# Patient Record
Sex: Male | Born: 1950 | Race: Black or African American | Hispanic: No | Smoking: Former smoker
Health system: Southern US, Community
[De-identification: ages and names within clinical notes are randomized; demographics above are authoritative.]

## PROBLEM LIST (undated history)

## (undated) DIAGNOSIS — R569 Unspecified convulsions: Secondary | ICD-10-CM

## (undated) DIAGNOSIS — H409 Unspecified glaucoma: Secondary | ICD-10-CM

---

## 2000-08-25 ENCOUNTER — Ambulatory Visit (HOSPITAL_COMMUNITY): Admission: RE | Admit: 2000-08-25 | Discharge: 2000-08-25 | Payer: Self-pay | Admitting: Orthopaedic Surgery

## 2001-01-19 ENCOUNTER — Ambulatory Visit (HOSPITAL_COMMUNITY): Admission: RE | Admit: 2001-01-19 | Discharge: 2001-01-19 | Payer: Self-pay | Admitting: Orthopaedic Surgery

## 2001-03-17 ENCOUNTER — Inpatient Hospital Stay (HOSPITAL_COMMUNITY): Admission: AD | Admit: 2001-03-17 | Discharge: 2001-03-18 | Payer: Self-pay | Admitting: Orthopaedic Surgery

## 2019-09-02 ENCOUNTER — Inpatient Hospital Stay (HOSPITAL_COMMUNITY)
Admission: EM | Admit: 2019-09-02 | Discharge: 2019-09-14 | DRG: 516 | Disposition: A | Discharge status: Skilled Nursing Facility | Payer: Medicare Other | Attending: Internal Medicine | Admitting: Internal Medicine

## 2019-09-02 ENCOUNTER — Other Ambulatory Visit: Payer: Self-pay

## 2019-09-02 ENCOUNTER — Encounter (HOSPITAL_COMMUNITY): Payer: Self-pay

## 2019-09-02 ENCOUNTER — Emergency Department (HOSPITAL_COMMUNITY): Payer: Medicare Other

## 2019-09-02 DIAGNOSIS — S7292XA Unspecified fracture of left femur, initial encounter for closed fracture: Secondary | ICD-10-CM | POA: Diagnosis not present

## 2019-09-02 DIAGNOSIS — D6861 Antiphospholipid syndrome: Secondary | ICD-10-CM | POA: Diagnosis present

## 2019-09-02 DIAGNOSIS — D62 Acute posthemorrhagic anemia: Secondary | ICD-10-CM | POA: Diagnosis not present

## 2019-09-02 DIAGNOSIS — Z23 Encounter for immunization: Secondary | ICD-10-CM | POA: Diagnosis present

## 2019-09-02 DIAGNOSIS — Z87891 Personal history of nicotine dependence: Secondary | ICD-10-CM

## 2019-09-02 DIAGNOSIS — S72142A Displaced intertrochanteric fracture of left femur, initial encounter for closed fracture: Secondary | ICD-10-CM | POA: Diagnosis present

## 2019-09-02 DIAGNOSIS — S42002A Fracture of unspecified part of left clavicle, initial encounter for closed fracture: Secondary | ICD-10-CM | POA: Diagnosis not present

## 2019-09-02 DIAGNOSIS — E8809 Other disorders of plasma-protein metabolism, not elsewhere classified: Secondary | ICD-10-CM | POA: Diagnosis present

## 2019-09-02 DIAGNOSIS — M25462 Effusion, left knee: Secondary | ICD-10-CM

## 2019-09-02 DIAGNOSIS — Z833 Family history of diabetes mellitus: Secondary | ICD-10-CM | POA: Diagnosis not present

## 2019-09-02 DIAGNOSIS — R509 Fever, unspecified: Secondary | ICD-10-CM

## 2019-09-02 DIAGNOSIS — M25571 Pain in right ankle and joints of right foot: Secondary | ICD-10-CM | POA: Diagnosis not present

## 2019-09-02 DIAGNOSIS — Z20828 Contact with and (suspected) exposure to other viral communicable diseases: Secondary | ICD-10-CM | POA: Diagnosis present

## 2019-09-02 DIAGNOSIS — E46 Unspecified protein-calorie malnutrition: Secondary | ICD-10-CM | POA: Diagnosis present

## 2019-09-02 DIAGNOSIS — S42025A Nondisplaced fracture of shaft of left clavicle, initial encounter for closed fracture: Secondary | ICD-10-CM | POA: Diagnosis present

## 2019-09-02 DIAGNOSIS — D509 Iron deficiency anemia, unspecified: Secondary | ICD-10-CM | POA: Diagnosis not present

## 2019-09-02 DIAGNOSIS — D638 Anemia in other chronic diseases classified elsewhere: Secondary | ICD-10-CM | POA: Diagnosis present

## 2019-09-02 DIAGNOSIS — Y92410 Unspecified street and highway as the place of occurrence of the external cause: Secondary | ICD-10-CM

## 2019-09-02 DIAGNOSIS — S72002A Fracture of unspecified part of neck of left femur, initial encounter for closed fracture: Secondary | ICD-10-CM | POA: Diagnosis present

## 2019-09-02 DIAGNOSIS — Z9889 Other specified postprocedural states: Secondary | ICD-10-CM | POA: Diagnosis not present

## 2019-09-02 DIAGNOSIS — G40909 Epilepsy, unspecified, not intractable, without status epilepticus: Secondary | ICD-10-CM

## 2019-09-02 DIAGNOSIS — Z7982 Long term (current) use of aspirin: Secondary | ICD-10-CM

## 2019-09-02 DIAGNOSIS — S42022A Displaced fracture of shaft of left clavicle, initial encounter for closed fracture: Secondary | ICD-10-CM | POA: Diagnosis not present

## 2019-09-02 DIAGNOSIS — R5082 Postprocedural fever: Secondary | ICD-10-CM | POA: Diagnosis not present

## 2019-09-02 DIAGNOSIS — Z79899 Other long term (current) drug therapy: Secondary | ICD-10-CM

## 2019-09-02 DIAGNOSIS — S42009A Fracture of unspecified part of unspecified clavicle, initial encounter for closed fracture: Secondary | ICD-10-CM

## 2019-09-02 DIAGNOSIS — Z6825 Body mass index (BMI) 25.0-25.9, adult: Secondary | ICD-10-CM

## 2019-09-02 DIAGNOSIS — M79671 Pain in right foot: Secondary | ICD-10-CM

## 2019-09-02 DIAGNOSIS — M79604 Pain in right leg: Secondary | ICD-10-CM | POA: Diagnosis not present

## 2019-09-02 DIAGNOSIS — D649 Anemia, unspecified: Secondary | ICD-10-CM | POA: Diagnosis not present

## 2019-09-02 DIAGNOSIS — Z820 Family history of epilepsy and other diseases of the nervous system: Secondary | ICD-10-CM

## 2019-09-02 DIAGNOSIS — G8921 Chronic pain due to trauma: Secondary | ICD-10-CM | POA: Diagnosis not present

## 2019-09-02 HISTORY — DX: Unspecified glaucoma: H40.9

## 2019-09-02 HISTORY — DX: Unspecified convulsions: R56.9

## 2019-09-02 LAB — PHENOBARBITAL LEVEL: Phenobarbital: 17.5 ug/mL (ref 15.0–30.0)

## 2019-09-02 LAB — CBC WITH DIFFERENTIAL/PLATELET
Abs Immature Granulocytes: 0.07 K/uL (ref 0.00–0.07)
Basophils Absolute: 0 K/uL (ref 0.0–0.1)
Basophils Relative: 0 %
Eosinophils Absolute: 0.2 K/uL (ref 0.0–0.5)
Eosinophils Relative: 2 %
HCT: 39.5 % (ref 39.0–52.0)
Hemoglobin: 12.7 g/dL — ABNORMAL LOW (ref 13.0–17.0)
Immature Granulocytes: 1 %
Lymphocytes Relative: 15 %
Lymphs Abs: 1.3 K/uL (ref 0.7–4.0)
MCH: 23.7 pg — ABNORMAL LOW (ref 26.0–34.0)
MCHC: 32.2 g/dL (ref 30.0–36.0)
MCV: 73.8 fL — ABNORMAL LOW (ref 80.0–100.0)
Monocytes Absolute: 0.7 K/uL (ref 0.1–1.0)
Monocytes Relative: 8 %
Neutro Abs: 6.4 K/uL (ref 1.7–7.7)
Neutrophils Relative %: 74 %
Platelets: 198 K/uL (ref 150–400)
RBC: 5.35 MIL/uL (ref 4.22–5.81)
RDW: 15.9 % — ABNORMAL HIGH (ref 11.5–15.5)
WBC: 8.6 K/uL (ref 4.0–10.5)
nRBC: 0 % (ref 0.0–0.2)

## 2019-09-02 LAB — BASIC METABOLIC PANEL WITH GFR
Anion gap: 10 (ref 5–15)
BUN: 9 mg/dL (ref 8–23)
CO2: 24 mmol/L (ref 22–32)
Calcium: 9.1 mg/dL (ref 8.9–10.3)
Chloride: 106 mmol/L (ref 98–111)
Creatinine, Ser: 0.79 mg/dL (ref 0.61–1.24)
GFR calc Af Amer: >60 mL/min
GFR calc non Af Amer: >60 mL/min
Glucose, Bld: 85 mg/dL (ref 70–99)
Potassium: 4.1 mmol/L (ref 3.5–5.1)
Sodium: 140 mmol/L (ref 135–145)

## 2019-09-02 LAB — I-STAT CHEM 8, ED
BUN: 8 mg/dL (ref 8–23)
Calcium, Ion: 1.09 mmol/L — ABNORMAL LOW (ref 1.15–1.40)
Chloride: 107 mmol/L (ref 98–111)
Creatinine, Ser: 0.7 mg/dL (ref 0.61–1.24)
Glucose, Bld: 74 mg/dL (ref 70–99)
HCT: 37 % — ABNORMAL LOW (ref 39.0–52.0)
Hemoglobin: 12.6 g/dL — ABNORMAL LOW (ref 13.0–17.0)
Potassium: 3.6 mmol/L (ref 3.5–5.1)
Sodium: 141 mmol/L (ref 135–145)
TCO2: 24 mmol/L (ref 22–32)

## 2019-09-02 LAB — PROTIME-INR
INR: 1.1 (ref 0.8–1.2)
Prothrombin Time: 13.7 seconds (ref 11.4–15.2)

## 2019-09-02 LAB — PHENYTOIN LEVEL, TOTAL: Phenytoin Lvl: 11.3 ug/mL (ref 10.0–20.0)

## 2019-09-02 LAB — ABO/RH: ABO/RH(D): B POS

## 2019-09-02 MED ORDER — HYDROMORPHONE HCL 1 MG/ML IJ SOLN: 1.0000 mg | INTRAMUSCULAR | Status: DC | PRN

## 2019-09-02 MED ORDER — IOHEXOL 300 MG/ML  SOLN
100.0000 mL | Freq: Once | INTRAMUSCULAR | Status: AC | PRN
  Administered 2019-09-02: 100 mL via INTRAVENOUS

## 2019-09-02 MED ORDER — FENTANYL CITRATE (PF) 100 MCG/2ML IJ SOLN
50.0000 ug | INTRAMUSCULAR | Status: DC | PRN
  Administered 2019-09-02: 50 ug via INTRAVENOUS
  Filled 2019-09-02: qty 2

## 2019-09-02 MED ORDER — HYDROCODONE-ACETAMINOPHEN 7.5-325 MG PO TABS
1.0000 | ORAL_TABLET | Freq: Four times a day (QID) | ORAL | Status: DC | PRN
  Administered 2019-09-03 – 2019-09-04 (×3): 1 via ORAL
  Filled 2019-09-02 (×3): qty 1

## 2019-09-02 MED ORDER — MORPHINE SULFATE (PF) 4 MG/ML IV SOLN
4.0000 mg | Freq: Once | INTRAVENOUS | Status: AC
  Administered 2019-09-02: 4 mg via INTRAVENOUS
  Filled 2019-09-02: qty 1

## 2019-09-02 MED ORDER — PHENYTOIN SODIUM EXTENDED 100 MG PO CAPS: 200.0000 mg | ORAL_CAPSULE | ORAL | Status: DC

## 2019-09-02 MED ORDER — ACETAMINOPHEN 325 MG PO TABS
650.0000 mg | ORAL_TABLET | Freq: Four times a day (QID) | ORAL | Status: DC | PRN
  Administered 2019-09-03 – 2019-09-04 (×2): 650 mg via ORAL
  Filled 2019-09-02 (×3): qty 2

## 2019-09-02 MED ORDER — BRIMONIDINE TARTRATE 0.2 % OP SOLN
1.0000 [drp] | Freq: Two times a day (BID) | OPHTHALMIC | Status: DC
  Administered 2019-09-03 – 2019-09-14 (×25): 1 [drp] via OPHTHALMIC
  Filled 2019-09-02: qty 5

## 2019-09-02 MED ORDER — HYDROMORPHONE HCL 1 MG/ML IJ SOLN
0.5000 mg | INTRAMUSCULAR | Status: DC | PRN
  Administered 2019-09-03 – 2019-09-04 (×5): 0.5 mg via INTRAVENOUS
  Filled 2019-09-02 (×5): qty 1

## 2019-09-02 MED ORDER — ONDANSETRON HCL 4 MG/2ML IJ SOLN
4.0000 mg | Freq: Once | INTRAMUSCULAR | Status: AC
  Administered 2019-09-02: 4 mg via INTRAVENOUS
  Filled 2019-09-02: qty 2

## 2019-09-02 MED ORDER — ENOXAPARIN SODIUM 40 MG/0.4ML ~~LOC~~ SOLN
40.0000 mg | SUBCUTANEOUS | Status: DC
  Administered 2019-09-03: 40 mg via SUBCUTANEOUS
  Filled 2019-09-02: qty 0.4

## 2019-09-02 MED ORDER — PHENYTOIN SODIUM EXTENDED 100 MG PO CAPS
100.0000 mg | ORAL_CAPSULE | Freq: Every morning | ORAL | Status: DC
  Administered 2019-09-03 – 2019-09-14 (×12): 100 mg via ORAL
  Filled 2019-09-02 (×13): qty 1

## 2019-09-02 MED ORDER — SODIUM CHLORIDE 0.9% FLUSH
3.0000 mL | Freq: Two times a day (BID) | INTRAVENOUS | Status: DC
  Administered 2019-09-03 – 2019-09-14 (×21): 3 mL via INTRAVENOUS

## 2019-09-02 MED ORDER — DORZOLAMIDE HCL-TIMOLOL MAL 2-0.5 % OP SOLN
1.0000 [drp] | Freq: Two times a day (BID) | OPHTHALMIC | Status: DC
  Administered 2019-09-03 – 2019-09-14 (×24): 1 [drp] via OPHTHALMIC
  Filled 2019-09-02: qty 10

## 2019-09-02 MED ORDER — PHENYTOIN SODIUM EXTENDED 100 MG PO CAPS
100.0000 mg | ORAL_CAPSULE | ORAL | Status: DC
  Administered 2019-09-03 – 2019-09-13 (×5): 100 mg via ORAL
  Filled 2019-09-02 (×5): qty 1

## 2019-09-02 MED ORDER — PHENOBARBITAL 32.4 MG PO TABS
64.8000 mg | ORAL_TABLET | Freq: Two times a day (BID) | ORAL | Status: DC
  Administered 2019-09-03 – 2019-09-14 (×24): 64.8 mg via ORAL
  Filled 2019-09-02 (×25): qty 2

## 2019-09-02 MED ORDER — ACETAMINOPHEN 650 MG RE SUPP: 650.0000 mg | Freq: Four times a day (QID) | RECTAL | Status: DC | PRN

## 2019-09-02 MED ORDER — ONDANSETRON HCL 4 MG PO TABS: 4.0000 mg | ORAL_TABLET | Freq: Four times a day (QID) | ORAL | Status: DC | PRN

## 2019-09-02 MED ORDER — ONDANSETRON HCL 4 MG/2ML IJ SOLN: 4.0000 mg | Freq: Four times a day (QID) | INTRAMUSCULAR | Status: DC | PRN

## 2019-09-02 MED ORDER — FENTANYL CITRATE (PF) 100 MCG/2ML IJ SOLN
50.0000 ug | Freq: Once | INTRAMUSCULAR | Status: AC
  Administered 2019-09-02: 50 ug via INTRAVENOUS
  Filled 2019-09-02: qty 2

## 2019-09-02 MED ORDER — POLYETHYLENE GLYCOL 3350 17 G PO PACK: 17.0000 g | PACK | Freq: Every day | ORAL | Status: DC | PRN

## 2019-09-02 NOTE — ED Notes (Signed)
Patient transported to CT 
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
## 2019-09-02 NOTE — ED Notes (Signed)
Pt vomited pinkish emesis on the floor. Pt requested his right foot to be adjusted.

## 2019-09-02 NOTE — ED Notes (Signed)
Kristopher Dalton [phone] brother is looking for an update on the patient
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
## 2019-09-03 ENCOUNTER — Encounter (HOSPITAL_COMMUNITY): Payer: Self-pay | Admitting: Internal Medicine

## 2019-09-03 DIAGNOSIS — G40909 Epilepsy, unspecified, not intractable, without status epilepticus: Secondary | ICD-10-CM

## 2019-09-03 DIAGNOSIS — M79604 Pain in right leg: Secondary | ICD-10-CM

## 2019-09-03 DIAGNOSIS — S42002A Fracture of unspecified part of left clavicle, initial encounter for closed fracture: Secondary | ICD-10-CM

## 2019-09-03 DIAGNOSIS — S7292XA Unspecified fracture of left femur, initial encounter for closed fracture: Secondary | ICD-10-CM

## 2019-09-03 DIAGNOSIS — S72142A Displaced intertrochanteric fracture of left femur, initial encounter for closed fracture: Secondary | ICD-10-CM | POA: Diagnosis present

## 2019-09-03 DIAGNOSIS — G8921 Chronic pain due to trauma: Secondary | ICD-10-CM

## 2019-09-03 DIAGNOSIS — S42009A Fracture of unspecified part of unspecified clavicle, initial encounter for closed fracture: Secondary | ICD-10-CM | POA: Diagnosis present

## 2019-09-03 LAB — BASIC METABOLIC PANEL
Anion gap: 12 (ref 5–15)
BUN: 14 mg/dL (ref 8–23)
CO2: 23 mmol/L (ref 22–32)
Calcium: 8.4 mg/dL — ABNORMAL LOW (ref 8.9–10.3)
Chloride: 101 mmol/L (ref 98–111)
Creatinine, Ser: 1.17 mg/dL (ref 0.61–1.24)
GFR calc Af Amer: >60 mL/min (ref >60)
GFR calc non Af Amer: >60 mL/min (ref >60)
Glucose, Bld: 171 mg/dL — ABNORMAL HIGH (ref 70–99)
Potassium: 3.7 mmol/L (ref 3.5–5.1)
Sodium: 136 mmol/L (ref 135–145)

## 2019-09-03 LAB — CBC
HCT: 34.6 % — ABNORMAL LOW (ref 39.0–52.0)
HCT: 30 % — ABNORMAL LOW (ref 39.0–52.0)
Hemoglobin: 11 g/dL — ABNORMAL LOW (ref 13.0–17.0)
Hemoglobin: 9.8 g/dL — ABNORMAL LOW (ref 13.0–17.0)
MCH: 23.6 pg — ABNORMAL LOW (ref 26.0–34.0)
MCH: 23.7 pg — ABNORMAL LOW (ref 26.0–34.0)
MCHC: 31.8 g/dL (ref 30.0–36.0)
MCHC: 32.7 g/dL (ref 30.0–36.0)
MCV: 74.1 fL — ABNORMAL LOW (ref 80.0–100.0)
MCV: 72.6 fL — ABNORMAL LOW (ref 80.0–100.0)
Platelets: 206 10*3/uL (ref 150–400)
Platelets: 157 10*3/uL (ref 150–400)
RBC: 4.67 MIL/uL (ref 4.22–5.81)
RBC: 4.13 MIL/uL — ABNORMAL LOW (ref 4.22–5.81)
RDW: 15.9 % — ABNORMAL HIGH (ref 11.5–15.5)
RDW: 15.7 % — ABNORMAL HIGH (ref 11.5–15.5)
WBC: 10.8 10*3/uL — ABNORMAL HIGH (ref 4.0–10.5)
WBC: 9.4 10*3/uL (ref 4.0–10.5)
nRBC: 0.2 % (ref 0.0–0.2)
nRBC: 0 % (ref 0.0–0.2)

## 2019-09-03 LAB — SURGICAL PCR SCREEN
MRSA, PCR: NEGATIVE
Staphylococcus aureus: NEGATIVE

## 2019-09-03 LAB — SARS CORONAVIRUS 2 (TAT 6-24 HRS): SARS Coronavirus 2: NEGATIVE

## 2019-09-03 LAB — HIV ANTIBODY (ROUTINE TESTING W REFLEX): HIV Screen 4th Generation wRfx: NONREACTIVE

## 2019-09-03 MED ORDER — INFLUENZA VAC A&B SA ADJ QUAD 0.5 ML IM PRSY
0.5000 mL | PREFILLED_SYRINGE | INTRAMUSCULAR | Status: DC
  Filled 2019-09-03 (×2): qty 0.5

## 2019-09-03 MED ORDER — PHENYTOIN SODIUM EXTENDED 100 MG PO CAPS
200.0000 mg | ORAL_CAPSULE | ORAL | Status: DC
  Administered 2019-09-03 – 2019-09-12 (×7): 200 mg via ORAL
  Filled 2019-09-03 (×7): qty 2

## 2019-09-03 MED ORDER — SODIUM CHLORIDE 0.9 % IV SOLN: INTRAVENOUS | Status: AC

## 2019-09-03 NOTE — Plan of Care (Signed)
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
## 2019-09-03 NOTE — Plan of Care (Signed)

## 2019-09-04 ENCOUNTER — Inpatient Hospital Stay (HOSPITAL_COMMUNITY): Payer: Medicare Other

## 2019-09-04 ENCOUNTER — Encounter (HOSPITAL_COMMUNITY): Payer: Self-pay | Admitting: Internal Medicine

## 2019-09-04 ENCOUNTER — Inpatient Hospital Stay (HOSPITAL_COMMUNITY): Payer: Medicare Other | Admitting: Certified Registered Nurse Anesthetist

## 2019-09-04 ENCOUNTER — Encounter (HOSPITAL_COMMUNITY)
Admission: EM | Disposition: A | Discharge status: Skilled Nursing Facility | Payer: Self-pay | Source: Home / Self Care | Attending: Internal Medicine

## 2019-09-04 DIAGNOSIS — M25571 Pain in right ankle and joints of right foot: Secondary | ICD-10-CM

## 2019-09-04 DIAGNOSIS — S72142A Displaced intertrochanteric fracture of left femur, initial encounter for closed fracture: Principal | ICD-10-CM

## 2019-09-04 DIAGNOSIS — R509 Fever, unspecified: Secondary | ICD-10-CM

## 2019-09-04 DIAGNOSIS — S42022A Displaced fracture of shaft of left clavicle, initial encounter for closed fracture: Secondary | ICD-10-CM

## 2019-09-04 HISTORY — PX: FEMUR IM NAIL: SHX1597

## 2019-09-04 HISTORY — PX: ORIF CLAVICULAR FRACTURE: SHX5055

## 2019-09-04 LAB — COMPREHENSIVE METABOLIC PANEL
ALT: 36 U/L (ref 0–44)
AST: 24 U/L (ref 15–41)
Albumin: 3 g/dL — ABNORMAL LOW (ref 3.5–5.0)
Alkaline Phosphatase: 90 U/L (ref 38–126)
Anion gap: 10 (ref 5–15)
BUN: 12 mg/dL (ref 8–23)
CO2: 22 mmol/L (ref 22–32)
Calcium: 8.3 mg/dL — ABNORMAL LOW (ref 8.9–10.3)
Chloride: 102 mmol/L (ref 98–111)
Creatinine, Ser: 0.84 mg/dL (ref 0.61–1.24)
GFR calc Af Amer: >60 mL/min (ref >60)
GFR calc non Af Amer: >60 mL/min (ref >60)
Glucose, Bld: 124 mg/dL — ABNORMAL HIGH (ref 70–99)
Potassium: 3.3 mmol/L — ABNORMAL LOW (ref 3.5–5.1)
Sodium: 134 mmol/L — ABNORMAL LOW (ref 135–145)
Total Bilirubin: 0.7 mg/dL (ref 0.3–1.2)
Total Protein: 5.8 g/dL — ABNORMAL LOW (ref 6.5–8.1)

## 2019-09-04 LAB — PREPARE RBC (CROSSMATCH)

## 2019-09-04 LAB — URINALYSIS, ROUTINE W REFLEX MICROSCOPIC
Bilirubin Urine: NEGATIVE
Glucose, UA: NEGATIVE mg/dL
Hgb urine dipstick: NEGATIVE
Ketones, ur: NEGATIVE mg/dL
Leukocytes,Ua: NEGATIVE
Nitrite: NEGATIVE
Protein, ur: NEGATIVE mg/dL
Specific Gravity, Urine: 1.021 (ref 1.005–1.030)
pH: 6 (ref 5.0–8.0)

## 2019-09-04 LAB — CBC
HCT: 31.2 % — ABNORMAL LOW (ref 39.0–52.0)
Hemoglobin: 10 g/dL — ABNORMAL LOW (ref 13.0–17.0)
MCH: 23.6 pg — ABNORMAL LOW (ref 26.0–34.0)
MCHC: 32.1 g/dL (ref 30.0–36.0)
MCV: 73.6 fL — ABNORMAL LOW (ref 80.0–100.0)
Platelets: 144 10*3/uL — ABNORMAL LOW (ref 150–400)
RBC: 4.24 MIL/uL (ref 4.22–5.81)
RDW: 15.7 % — ABNORMAL HIGH (ref 11.5–15.5)
WBC: 10.5 10*3/uL (ref 4.0–10.5)
nRBC: 0 % (ref 0.0–0.2)

## 2019-09-04 SURGERY — INSERTION, INTRAMEDULLARY ROD, FEMUR
Anesthesia: General | Site: Shoulder | Laterality: Left

## 2019-09-04 MED ORDER — ACETAMINOPHEN 325 MG PO TABS
325.0000 mg | ORAL_TABLET | Freq: Four times a day (QID) | ORAL | Status: DC | PRN
  Administered 2019-09-05 – 2019-09-06 (×4): 650 mg via ORAL
  Filled 2019-09-04 (×4): qty 2

## 2019-09-04 MED ORDER — METOCLOPRAMIDE HCL 5 MG/ML IJ SOLN: 5.0000 mg | Freq: Three times a day (TID) | INTRAMUSCULAR | Status: DC | PRN

## 2019-09-04 MED ORDER — SODIUM CHLORIDE 0.9% IV SOLUTION: Freq: Once | INTRAVENOUS | Status: DC

## 2019-09-04 MED ORDER — FENTANYL CITRATE (PF) 250 MCG/5ML IJ SOLN
INTRAMUSCULAR | Status: AC
  Filled 2019-09-04: qty 5

## 2019-09-04 MED ORDER — ACETAMINOPHEN 500 MG PO TABS
500.0000 mg | ORAL_TABLET | Freq: Four times a day (QID) | ORAL | Status: AC
  Administered 2019-09-04 – 2019-09-05 (×3): 500 mg via ORAL
  Filled 2019-09-04 (×4): qty 1

## 2019-09-04 MED ORDER — ONDANSETRON HCL 4 MG/2ML IJ SOLN: 4.0000 mg | Freq: Four times a day (QID) | INTRAMUSCULAR | Status: DC | PRN

## 2019-09-04 MED ORDER — PHENYLEPHRINE HCL-NACL 10-0.9 MG/250ML-% IV SOLN
INTRAVENOUS | Status: DC | PRN
  Administered 2019-09-04: 25 ug/min via INTRAVENOUS

## 2019-09-04 MED ORDER — SODIUM CHLORIDE 0.9 % IV SOLN
2.0000 g | INTRAVENOUS | Status: AC
  Administered 2019-09-04: 2 g via INTRAVENOUS
  Filled 2019-09-04: qty 2

## 2019-09-04 MED ORDER — METOCLOPRAMIDE HCL 5 MG PO TABS: 5.0000 mg | ORAL_TABLET | Freq: Three times a day (TID) | ORAL | Status: DC | PRN

## 2019-09-04 MED ORDER — PHENYLEPHRINE 40 MCG/ML (10ML) SYRINGE FOR IV PUSH (FOR BLOOD PRESSURE SUPPORT)
PREFILLED_SYRINGE | INTRAVENOUS | Status: AC
  Filled 2019-09-04: qty 10

## 2019-09-04 MED ORDER — ONDANSETRON HCL 4 MG/2ML IJ SOLN
INTRAMUSCULAR | Status: AC
  Filled 2019-09-04: qty 2

## 2019-09-04 MED ORDER — BUPIVACAINE-EPINEPHRINE 0.5% -1:200000 IJ SOLN
INTRAMUSCULAR | Status: AC
  Filled 2019-09-04: qty 1

## 2019-09-04 MED ORDER — ACETAMINOPHEN 10 MG/ML IV SOLN
INTRAVENOUS | Status: DC | PRN
  Administered 2019-09-04: 1000 mg via INTRAVENOUS

## 2019-09-04 MED ORDER — CHLORHEXIDINE GLUCONATE 4 % EX LIQD: 60.0000 mL | Freq: Once | CUTANEOUS | Status: DC

## 2019-09-04 MED ORDER — CEFAZOLIN SODIUM-DEXTROSE 2-4 GM/100ML-% IV SOLN: 2.0000 g | INTRAVENOUS | Status: DC

## 2019-09-04 MED ORDER — ONDANSETRON HCL 4 MG PO TABS
4.0000 mg | ORAL_TABLET | Freq: Four times a day (QID) | ORAL | Status: DC | PRN
  Filled 2019-09-04: qty 1

## 2019-09-04 MED ORDER — DEXAMETHASONE SODIUM PHOSPHATE 10 MG/ML IJ SOLN
INTRAMUSCULAR | Status: DC | PRN
  Administered 2019-09-04: 10 mg via INTRAVENOUS

## 2019-09-04 MED ORDER — POVIDONE-IODINE 10 % EX SWAB: 2.0000 "application " | Freq: Once | CUTANEOUS | Status: DC

## 2019-09-04 MED ORDER — SODIUM CHLORIDE 0.9 % IV SOLN
2.0000 g | Freq: Two times a day (BID) | INTRAVENOUS | Status: AC
  Administered 2019-09-04: 2 g via INTRAVENOUS
  Filled 2019-09-04: qty 2

## 2019-09-04 MED ORDER — 0.9 % SODIUM CHLORIDE (POUR BTL) OPTIME
TOPICAL | Status: DC | PRN
  Administered 2019-09-04: 1000 mL

## 2019-09-04 MED ORDER — ENSURE PRE-SURGERY PO LIQD
296.0000 mL | Freq: Once | ORAL | Status: DC
  Filled 2019-09-04: qty 296

## 2019-09-04 MED ORDER — SUCCINYLCHOLINE CHLORIDE 200 MG/10ML IV SOSY
PREFILLED_SYRINGE | INTRAVENOUS | Status: AC
  Filled 2019-09-04: qty 10

## 2019-09-04 MED ORDER — BUPIVACAINE-EPINEPHRINE 0.5% -1:200000 IJ SOLN
INTRAMUSCULAR | Status: DC | PRN
  Administered 2019-09-04: 11 mL

## 2019-09-04 MED ORDER — VANCOMYCIN HCL 1500 MG/300ML IV SOLN
1500.0000 mg | INTRAVENOUS | Status: DC
  Administered 2019-09-04: 1500 mg via INTRAVENOUS
  Filled 2019-09-04 (×2): qty 300

## 2019-09-04 MED ORDER — ACETAMINOPHEN 10 MG/ML IV SOLN
INTRAVENOUS | Status: AC
  Filled 2019-09-04: qty 100

## 2019-09-04 MED ORDER — DEXTROSE-NACL 5-0.45 % IV SOLN: INTRAVENOUS | Status: DC

## 2019-09-04 MED ORDER — VANCOMYCIN HCL IN DEXTROSE 1-5 GM/200ML-% IV SOLN
1000.0000 mg | INTRAVENOUS | Status: DC
  Filled 2019-09-04: qty 200

## 2019-09-04 MED ORDER — DEXAMETHASONE SODIUM PHOSPHATE 10 MG/ML IJ SOLN
INTRAMUSCULAR | Status: AC
  Filled 2019-09-04: qty 1

## 2019-09-04 MED ORDER — FENTANYL CITRATE (PF) 250 MCG/5ML IJ SOLN
INTRAMUSCULAR | Status: DC | PRN
  Administered 2019-09-04 (×2): 50 ug via INTRAVENOUS

## 2019-09-04 MED ORDER — LACTATED RINGERS IV SOLN: INTRAVENOUS | Status: DC

## 2019-09-04 MED ORDER — MIDAZOLAM HCL 2 MG/2ML IJ SOLN
INTRAMUSCULAR | Status: AC
  Filled 2019-09-04: qty 2

## 2019-09-04 MED ORDER — TRAMADOL HCL 50 MG PO TABS
50.0000 mg | ORAL_TABLET | Freq: Four times a day (QID) | ORAL | Status: DC
  Administered 2019-09-04 – 2019-09-14 (×38): 50 mg via ORAL
  Filled 2019-09-04 (×39): qty 1

## 2019-09-04 MED ORDER — CYANOCOBALAMIN 500 MCG PO TABS
250.0000 ug | ORAL_TABLET | Freq: Every day | ORAL | Status: DC
  Administered 2019-09-04 – 2019-09-05 (×2): 250 ug via ORAL
  Filled 2019-09-04 (×3): qty 1

## 2019-09-04 MED ORDER — HYDROCODONE-ACETAMINOPHEN 7.5-325 MG PO TABS
1.0000 | ORAL_TABLET | ORAL | Status: DC | PRN
  Administered 2019-09-05: 2 via ORAL
  Administered 2019-09-07 – 2019-09-10 (×2): 1 via ORAL
  Administered 2019-09-11: 2 via ORAL
  Filled 2019-09-04: qty 2
  Filled 2019-09-04: qty 1
  Filled 2019-09-04: qty 2
  Filled 2019-09-04: qty 1

## 2019-09-04 MED ORDER — SUGAMMADEX SODIUM 200 MG/2ML IV SOLN
INTRAVENOUS | Status: DC | PRN
  Administered 2019-09-04: 150 mg via INTRAVENOUS

## 2019-09-04 MED ORDER — LIDOCAINE 2% (20 MG/ML) 5 ML SYRINGE
INTRAMUSCULAR | Status: DC | PRN
  Administered 2019-09-04: 60 mg via INTRAVENOUS

## 2019-09-04 MED ORDER — ROCURONIUM BROMIDE 10 MG/ML (PF) SYRINGE
PREFILLED_SYRINGE | INTRAVENOUS | Status: AC
  Filled 2019-09-04: qty 10

## 2019-09-04 MED ORDER — CHLORHEXIDINE GLUCONATE CLOTH 2 % EX PADS
6.0000 | MEDICATED_PAD | Freq: Every day | CUTANEOUS | Status: DC
  Administered 2019-09-05 – 2019-09-14 (×9): 6 via TOPICAL

## 2019-09-04 MED ORDER — MIDAZOLAM HCL 2 MG/2ML IJ SOLN
INTRAMUSCULAR | Status: DC | PRN
  Administered 2019-09-04: 2 mg via INTRAVENOUS

## 2019-09-04 MED ORDER — SODIUM CHLORIDE 0.9 % IV SOLN: 2.0000 g | Freq: Two times a day (BID) | INTRAVENOUS | Status: DC

## 2019-09-04 MED ORDER — PHENYLEPHRINE 40 MCG/ML (10ML) SYRINGE FOR IV PUSH (FOR BLOOD PRESSURE SUPPORT)
PREFILLED_SYRINGE | INTRAVENOUS | Status: DC | PRN
  Administered 2019-09-04: 120 ug via INTRAVENOUS
  Administered 2019-09-04: 160 ug via INTRAVENOUS
  Administered 2019-09-04: 120 ug via INTRAVENOUS

## 2019-09-04 MED ORDER — TETANUS-DIPHTH-ACELL PERTUSSIS 5-2.5-18.5 LF-MCG/0.5 IM SUSP
0.5000 mL | Freq: Once | INTRAMUSCULAR | Status: AC
  Administered 2019-09-04: 0.5 mL via INTRAMUSCULAR
  Filled 2019-09-04: qty 0.5

## 2019-09-04 MED ORDER — DOCUSATE SODIUM 100 MG PO CAPS
100.0000 mg | ORAL_CAPSULE | Freq: Two times a day (BID) | ORAL | Status: DC
  Administered 2019-09-04 – 2019-09-14 (×15): 100 mg via ORAL
  Filled 2019-09-04 (×19): qty 1

## 2019-09-04 MED ORDER — ONDANSETRON HCL 4 MG/2ML IJ SOLN
INTRAMUSCULAR | Status: DC | PRN
  Administered 2019-09-04: 4 mg via INTRAVENOUS

## 2019-09-04 MED ORDER — HYDROCODONE-ACETAMINOPHEN 5-325 MG PO TABS
1.0000 | ORAL_TABLET | ORAL | Status: DC | PRN
  Administered 2019-09-05: 2 via ORAL
  Administered 2019-09-07 – 2019-09-08 (×2): 1 via ORAL
  Administered 2019-09-11: 2 via ORAL
  Administered 2019-09-11: 1 via ORAL
  Administered 2019-09-12: 2 via ORAL
  Filled 2019-09-04: qty 1
  Filled 2019-09-04: qty 2
  Filled 2019-09-04: qty 1
  Filled 2019-09-04: qty 2
  Filled 2019-09-04: qty 1
  Filled 2019-09-04: qty 2

## 2019-09-04 MED ORDER — PROPOFOL 10 MG/ML IV BOLUS
INTRAVENOUS | Status: AC
  Filled 2019-09-04: qty 20

## 2019-09-04 MED ORDER — LIDOCAINE 2% (20 MG/ML) 5 ML SYRINGE
INTRAMUSCULAR | Status: AC
  Filled 2019-09-04: qty 5

## 2019-09-04 MED ORDER — ROCURONIUM BROMIDE 10 MG/ML (PF) SYRINGE
PREFILLED_SYRINGE | INTRAVENOUS | Status: DC | PRN
  Administered 2019-09-04: 20 mg via INTRAVENOUS
  Administered 2019-09-04: 50 mg via INTRAVENOUS
  Administered 2019-09-04: 30 mg via INTRAVENOUS

## 2019-09-04 MED ORDER — MORPHINE SULFATE (PF) 2 MG/ML IV SOLN
0.5000 mg | INTRAVENOUS | Status: DC | PRN
  Administered 2019-09-04: 1 mg via INTRAVENOUS
  Filled 2019-09-04: qty 1

## 2019-09-04 MED ORDER — SUCCINYLCHOLINE CHLORIDE 200 MG/10ML IV SOSY
PREFILLED_SYRINGE | INTRAVENOUS | Status: DC | PRN
  Administered 2019-09-04: 100 mg via INTRAVENOUS

## 2019-09-04 MED ORDER — LACTATED RINGERS IV SOLN: INTRAVENOUS | Status: DC | PRN

## 2019-09-04 MED ORDER — PROPOFOL 10 MG/ML IV BOLUS
INTRAVENOUS | Status: DC | PRN
  Administered 2019-09-04: 120 mg via INTRAVENOUS

## 2019-09-04 MED ORDER — VANCOMYCIN HCL 1500 MG/300ML IV SOLN
1500.0000 mg | INTRAVENOUS | Status: DC
  Filled 2019-09-04: qty 300

## 2019-09-04 MED ORDER — VITAMIN D 25 MCG (1000 UNIT) PO TABS
1000.0000 [IU] | ORAL_TABLET | Freq: Every day | ORAL | Status: DC
  Administered 2019-09-04 – 2019-09-14 (×11): 1000 [IU] via ORAL
  Filled 2019-09-04 (×14): qty 1

## 2019-09-04 SURGICAL SUPPLY — 76 items
BIT DRILL CANN LG 4.3MM (BIT) IMPLANT
BIT DRILL CLAV ALPS 2.7X145 (BIT) ×1 IMPLANT
BLADE SURG 15 STRL LF DISP TIS (BLADE) ×1 IMPLANT
BLADE SURG 15 STRL SS (BLADE)
BNDG COHESIVE 4X5 TAN STRL (GAUZE/BANDAGES/DRESSINGS) ×3 IMPLANT
COVER BACK TABLE 60X90IN (DRAPES) ×1 IMPLANT
COVER MAYO STAND STRL (DRAPES) ×3 IMPLANT
COVER PERINEAL POST (MISCELLANEOUS) ×3 IMPLANT
COVER SURGICAL LIGHT HANDLE (MISCELLANEOUS) ×4 IMPLANT
COVER WAND RF STERILE (DRAPES) ×3 IMPLANT
DRAPE C-ARM 42X72 X-RAY (DRAPES) ×3 IMPLANT
DRAPE IMP U-DRAPE 54X76 (DRAPES) ×2 IMPLANT
DRAPE INCISE IOBAN 66X45 STRL (DRAPES) ×1 IMPLANT
DRAPE ORTHO SPLIT 77X108 STRL (DRAPES) ×6
DRAPE STERI IOBAN 125X83 (DRAPES) ×3 IMPLANT
DRAPE SURG 17X23 STRL (DRAPES) ×3 IMPLANT
DRAPE SURG ORHT 6 SPLT 77X108 (DRAPES) IMPLANT
DRAPE U-SHAPE 47X51 STRL (DRAPES) ×5 IMPLANT
DRILL BIT CANN LG 4.3MM (BIT) ×3
DRSG ADAPTIC 3X8 NADH LF (GAUZE/BANDAGES/DRESSINGS) ×2 IMPLANT
DRSG PAD ABDOMINAL 8X10 ST (GAUZE/BANDAGES/DRESSINGS) ×2 IMPLANT
DURAPREP 26ML APPLICATOR (WOUND CARE) ×5 IMPLANT
ELECT REM PT RETURN 9FT ADLT (ELECTROSURGICAL) ×3
ELECTRODE REM PT RTRN 9FT ADLT (ELECTROSURGICAL) ×2 IMPLANT
EVACUATOR 1/8 PVC DRAIN (DRAIN) IMPLANT
GAUZE SPONGE 4X4 12PLY STRL (GAUZE/BANDAGES/DRESSINGS) ×1 IMPLANT
GAUZE SPONGE 4X4 12PLY STRL LF (GAUZE/BANDAGES/DRESSINGS) ×4 IMPLANT
GAUZE XEROFORM 1X8 LF (GAUZE/BANDAGES/DRESSINGS) ×1 IMPLANT
GLOVE BIOGEL PI IND STRL 8 (GLOVE) ×4 IMPLANT
GLOVE BIOGEL PI INDICATOR 8 (GLOVE) ×2
GLOVE ORTHO TXT STRL SZ7.5 (GLOVE) ×6 IMPLANT
GOWN STRL REUS W/ TWL LRG LVL3 (GOWN DISPOSABLE) ×2 IMPLANT
GOWN STRL REUS W/ TWL XL LVL3 (GOWN DISPOSABLE) ×3 IMPLANT
GOWN STRL REUS W/TWL 2XL LVL3 (GOWN DISPOSABLE) ×2 IMPLANT
GOWN STRL REUS W/TWL LRG LVL3 (GOWN DISPOSABLE) ×6
GOWN STRL REUS W/TWL XL LVL3 (GOWN DISPOSABLE) ×6
GUIDEPIN 3.2X17.5 THRD DISP (PIN) ×1 IMPLANT
KIT BASIN OR (CUSTOM PROCEDURE TRAY) ×3 IMPLANT
KIT TURNOVER KIT B (KITS) ×3 IMPLANT
MANIFOLD NEPTUNE II (INSTRUMENTS) ×3 IMPLANT
NAIL HIP FRACT 130D 11X180 (Screw) ×1 IMPLANT
NDL HYPO 25GX1X1/2 BEV (NEEDLE) IMPLANT
NEEDLE HYPO 25GX1X1/2 BEV (NEEDLE) ×3 IMPLANT
NS IRRIG 1000ML POUR BTL (IV SOLUTION) ×3 IMPLANT
PACK GENERAL/GYN (CUSTOM PROCEDURE TRAY) ×3 IMPLANT
PACK SHOULDER (CUSTOM PROCEDURE TRAY) ×1 IMPLANT
PAD ABD 8X10 STRL (GAUZE/BANDAGES/DRESSINGS) ×4 IMPLANT
PAD ARMBOARD 7.5X6 YLW CONV (MISCELLANEOUS) ×12 IMPLANT
PENCIL BUTTON HOLSTER BLD 10FT (ELECTRODE) ×1 IMPLANT
PLATE ANT CLAV 120 11H (Plate) ×2 IMPLANT
SCREW BONE CORTICAL 5.0X36 (Screw) ×2 IMPLANT
SCREW CORT LP 3.5X14 (Screw) ×1 IMPLANT
SCREW CORT LP T15 3.5X16 (Screw) ×4 IMPLANT
SCREW LAG 10.5MMX105MM HFN (Screw) ×1 IMPLANT
SCREW LOCK CORT STAR 3.5X14 (Screw) ×4 IMPLANT
SCREW LOCK CORT STAR 3.5X30 (Screw) ×4 IMPLANT
SCREW LP NL T15 3.5X20 (Screw) ×2 IMPLANT
SCREW LP NL T15 3.5X22 (Screw) ×2 IMPLANT
SCREW LP NL T15 3.5X24 (Screw) ×1 IMPLANT
SPONGE LAP 18X18 RF (DISPOSABLE) ×3 IMPLANT
SPONGE LAP 4X18 RFD (DISPOSABLE) ×2 IMPLANT
STAPLER VISISTAT 35W (STAPLE) ×2 IMPLANT
STRIP CLOSURE SKIN 1/2X4 (GAUZE/BANDAGES/DRESSINGS) ×1 IMPLANT
SUCTION FRAZIER HANDLE 10FR (MISCELLANEOUS) ×1
SUCTION TUBE FRAZIER 10FR DISP (MISCELLANEOUS) ×2 IMPLANT
SUT PROLENE 3 0 PS 1 (SUTURE) ×1 IMPLANT
SUT VIC AB 0 CT1 27 (SUTURE) ×6
SUT VIC AB 0 CT1 27XBRD ANBCTR (SUTURE) ×3 IMPLANT
SUT VIC AB 2-0 CT1 27 (SUTURE) ×6
SUT VIC AB 2-0 CT1 TAPERPNT 27 (SUTURE) ×4 IMPLANT
SYR CONTROL 10ML LL (SYRINGE) ×2 IMPLANT
TAPE CLOTH SURG 4X10 WHT LF (GAUZE/BANDAGES/DRESSINGS) ×1 IMPLANT
TAPE CLOTH SURG 6X10 WHT LF (GAUZE/BANDAGES/DRESSINGS) ×2 IMPLANT
TRAY FOLEY MTR SLVR 16FR STAT (SET/KITS/TRAYS/PACK) ×2 IMPLANT
WATER STERILE IRR 1000ML POUR (IV SOLUTION) ×5 IMPLANT
YANKAUER SUCT BULB TIP NO VENT (SUCTIONS) ×3 IMPLANT

## 2019-09-04 NOTE — Plan of Care (Signed)
  Problem: Pain Managment: Goal: General experience of comfort will improve Outcome: Progressing   Problem: Safety: Goal: Ability to remain free from injury will improve Outcome: Progressing   Problem: Skin Integrity: Goal: Risk for impaired skin integrity will decrease Outcome: Progressing   

## 2019-09-04 NOTE — Progress Notes (Signed)
Notified Dr Trilby Drummer of pt status. Will continue to monitor.

## 2019-09-04 NOTE — Transfer of Care (Signed)
Immediate Anesthesia Transfer of Care Note  Patient: Kristopher Dalton  Procedure(s) Performed: SHORT AFFIXUS TROCHANTERIC NAIL (Left Hip) OPEN REDUCTION INTERNAL FIXATION (ORIF) PLATE CLAVICULAR FRACTURE (Left Shoulder)  Patient Location: PACU  Anesthesia Type:General  Level of Consciousness: awake and alert   Airway & Oxygen Therapy: Patient Spontanous Breathing  Post-op Assessment: Report given to RN, Post -op Vital signs reviewed and stable and Patient moving all extremities X 4  Post vital signs: Reviewed and stable  Last Vitals:  Vitals Value Taken Time  BP    Temp    Pulse    Resp    SpO2      Last Pain:  Vitals:   09/04/19 1206  TempSrc:   PainSc: 0-No pain      Patients Stated Pain Goal: 3 (03/83/33 8329)  Complications: No apparent anesthesia complications

## 2019-09-04 NOTE — Op Note (Signed)
Preop diagnosis: Left intertrochanteric hip fracture and left midshaft clavicle fracture both closed.  Postop diagnosis: Same  Procedure: Left short affixes Biomet trochanteric nail with interlock.  105 lag screw short 11 mm diameter nail.  On the clavicle APLS clavicle anterior 11 hole plate with combination of locking and nonlocking screws.  Surgeon: Rodell Perna, MD  Anesthesia General tracheal plus Marcaine local in the hip 20 cc and 11 cc in the clavicle.  EBL: See anesthetic record.  Procedure after induction general anesthesia patient underwent a temperature and was on the medical hospitalist service patient had been given cefotetan and and also vancomycin so no additional antibiotics were required.  Intertrochanteric hip fracture was first the patient placed on the Hana table arm was left in the sling well leg holder was used for opposite leg in the 9090 position careful padding positioning.  Hana boot been applied for patient placed on the Hana table traction and internal rotation C-arm visualization AP and lateral with good reduction.  Hip was prepped with DuraPrep the usual large shower curtain Betadine Steri-Drape was applied timeout procedure completed.  DuraPrep is been used.  Incision was made proximal trochanter tip was palpated K wire Steinmann pin drilled checked under fluoroscopy overreamed and then the lead millimeter nail was pounded down into place there is near-anatomic position.  Guide was used for pin selected at 105 which was excellent length center center on AP and lateral fluoroscopic pictures.  Proximally was locked down and secured.  Distal interlock 36 mm screw was placed and SideArm jig was removed final spot pictures were taken showing good position alignment copious irrigation closure of the deep fascial layer with #1 Vicryl 2-0 Vicryl subtenons tissue skin staple closure Marcaine infiltration of all 3 small incisions.  Postop dressing applied 4 x 4's and tape and then  drapes were removed.  1015 applied over the shoulder with the arm still in the sling and DuraPrep split sheets drapes sterile skin marker Betadine Steri-Drape application and mini timeout procedure.  Left clavicle images were displayed and there was obvious deformity and instability midshaft left clavicle.  Clavicle exposed proximally distally and then moved toward the center at the level to fracture fracture was evacuated hematoma reduced and anterior Zimmer Biomet plate was selected 11 hole custom bent placed held with a self-retaining clamp and then screws were placed on each hand and then lag screws compressing the fracture in near-anatomic position.  There was good stability.  Combination of locking and nonlocking screws were used.  Good stability was obtained and posteriorly tibial screw the leg the fracture was palpable and was not too long.  Medially screws were 14 and 16 mm in length.  Muscles reapproximated over the top with 0 Vicryl sutures 2-0 Vicryl subtendinous tissue skin staple closure 4 x 4's tape and reapplication of a sling patient tolerated procedure well Marcaine was infiltrated prior to dressing.

## 2019-09-04 NOTE — Progress Notes (Signed)
Pt temp 102.5 and c/o pain 10/10 to right foot. Rt foot with edema. Will give prn Tylenol and pain med. RN will continue to monitor.

## 2019-09-04 NOTE — Progress Notes (Signed)
Paged by RN at ~03:35 about new fever of 102.5 and ongoing right ankle pain throughout her shift (now 10/10). The anle pain was not significant on his presentation but has become his primary source of pain overnight. He has a history of multiple surgeries on his Right ankle and multiple scars and posterior area of soft tissue (which feels like fluid, but he states is chronic).  Patient was visited at bedside. Patient is tired appearing but alert. He states he feels okay other than his ankle pain and fever. No shortness of breath. He has a wound on the medial aspect of his right ankle that is more significant than on initial presentation (more erythematous, possibly larger). No purulence nor spreading erythema noted. Picture taken for reference. No leukocytosis noted on cbc last night.  - Xray of his foot and lower ankle. - CXR, U/A, Urine Cx, and Blood Culture for fever work up.  - Morning CBC will also be obtained.

## 2019-09-04 NOTE — Plan of Care (Signed)
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
## 2019-09-04 NOTE — Anesthesia Preprocedure Evaluation (Addendum)
Anesthesia Evaluation  Patient identified by MRN, date of birth, ID band Patient awake    Reviewed: Allergy & Precautions, H&P , NPO status , Patient's Chart, lab work & pertinent test results  Airway Mallampati: II  TM Distance: >3 FB Neck ROM: Full    Dental no notable dental hx. (+) Edentulous Upper, Partial Lower, Dental Advisory Given   Pulmonary neg pulmonary ROS, former smoker,    Pulmonary exam normal breath sounds clear to auscultation       Cardiovascular negative cardio ROS   Rhythm:Regular Rate:Normal     Neuro/Psych Seizures -,  negative psych ROS   GI/Hepatic negative GI ROS, Neg liver ROS,   Endo/Other  negative endocrine ROS  Renal/GU negative Renal ROS  negative genitourinary   Musculoskeletal   Abdominal   Peds  Hematology negative hematology ROS (+)   Anesthesia Other Findings   Reproductive/Obstetrics negative OB ROS                            Anesthesia Physical Anesthesia Plan  ASA: II  Anesthesia Plan: General   Post-op Pain Management:    Induction: Intravenous  PONV Risk Score and Plan: 3 and Ondansetron, Dexamethasone and Midazolam  Airway Management Planned: Oral ETT  Additional Equipment:   Intra-op Plan:   Post-operative Plan: Extubation in OR  Informed Consent: I have reviewed the patients History and Physical, chart, labs and discussed the procedure including the risks, benefits and alternatives for the proposed anesthesia with the patient or authorized representative who has indicated his/her understanding and acceptance.     Dental advisory given  Plan Discussed with: CRNA  Anesthesia Plan Comments:         Anesthesia Quick Evaluation

## 2019-09-04 NOTE — Anesthesia Postprocedure Evaluation (Signed)
Anesthesia Post Note  Patient: Kristopher Dalton  Procedure(s) Performed: SHORT AFFIXUS TROCHANTERIC NAIL (Left Hip) OPEN REDUCTION INTERNAL FIXATION (ORIF) PLATE CLAVICULAR FRACTURE (Left Shoulder)     Patient location during evaluation: PACU Anesthesia Type: General Level of consciousness: awake and alert Pain management: pain level controlled Vital Signs Assessment: post-procedure vital signs reviewed and stable Respiratory status: spontaneous breathing, nonlabored ventilation and respiratory function stable Cardiovascular status: blood pressure returned to baseline and stable Postop Assessment: no apparent nausea or vomiting Anesthetic complications: no    Last Vitals:  Vitals:   09/04/19 1415 09/04/19 1430  BP: 126/72 111/68  Pulse: 92 90  Resp: 16 19  Temp: 37.7 C   SpO2: 93% 92%    Last Pain:  Vitals:   09/04/19 1415  TempSrc:   PainSc: 0-No pain                 Ithiel Liebler,W. EDMOND

## 2019-09-04 NOTE — Anesthesia Procedure Notes (Signed)
Procedure Name: Intubation Date/Time: 09/04/2019 12:14 PM Performed by: Harden Mo, CRNA Pre-anesthesia Checklist: Patient identified, Emergency Drugs available, Suction available and Patient being monitored Patient Re-evaluated:Patient Re-evaluated prior to induction Oxygen Delivery Method: Circle System Utilized Preoxygenation: Pre-oxygenation with 100% oxygen Induction Type: IV induction and Rapid sequence Laryngoscope Size: Miller and 2 Grade View: Grade I Tube type: Oral Tube size: 7.5 mm Number of attempts: 1 Airway Equipment and Method: Stylet and Oral airway Placement Confirmation: ETT inserted through vocal cords under direct vision,  positive ETCO2 and breath sounds checked- equal and bilateral Secured at: 23 cm Tube secured with: Tape Dental Injury: Teeth and Oropharynx as per pre-operative assessment

## 2019-09-05 ENCOUNTER — Encounter (HOSPITAL_COMMUNITY): Payer: Self-pay | Admitting: Internal Medicine

## 2019-09-05 DIAGNOSIS — E8809 Other disorders of plasma-protein metabolism, not elsewhere classified: Secondary | ICD-10-CM | POA: Diagnosis present

## 2019-09-05 DIAGNOSIS — D62 Acute posthemorrhagic anemia: Secondary | ICD-10-CM | POA: Diagnosis present

## 2019-09-05 DIAGNOSIS — D509 Iron deficiency anemia, unspecified: Secondary | ICD-10-CM

## 2019-09-05 DIAGNOSIS — E46 Unspecified protein-calorie malnutrition: Secondary | ICD-10-CM | POA: Diagnosis present

## 2019-09-05 LAB — URINALYSIS, ROUTINE W REFLEX MICROSCOPIC
Bilirubin Urine: NEGATIVE
Glucose, UA: NEGATIVE mg/dL
Hgb urine dipstick: NEGATIVE
Ketones, ur: NEGATIVE mg/dL
Leukocytes,Ua: NEGATIVE
Nitrite: NEGATIVE
Protein, ur: NEGATIVE mg/dL
Specific Gravity, Urine: 1.01 (ref 1.005–1.030)
pH: 7 (ref 5.0–8.0)

## 2019-09-05 LAB — CBC WITH DIFFERENTIAL/PLATELET
Abs Immature Granulocytes: 0.07 10*3/uL (ref 0.00–0.07)
Basophils Absolute: 0 10*3/uL (ref 0.0–0.1)
Basophils Relative: 0 %
Eosinophils Absolute: 0.2 10*3/uL (ref 0.0–0.5)
Eosinophils Relative: 2 %
HCT: 22.5 % — ABNORMAL LOW (ref 39.0–52.0)
Hemoglobin: 7.3 g/dL — ABNORMAL LOW (ref 13.0–17.0)
Immature Granulocytes: 1 %
Lymphocytes Relative: 13 %
Lymphs Abs: 1.1 10*3/uL (ref 0.7–4.0)
MCH: 23.6 pg — ABNORMAL LOW (ref 26.0–34.0)
MCHC: 32.4 g/dL (ref 30.0–36.0)
MCV: 72.8 fL — ABNORMAL LOW (ref 80.0–100.0)
Monocytes Absolute: 1.1 10*3/uL — ABNORMAL HIGH (ref 0.1–1.0)
Monocytes Relative: 13 %
Neutro Abs: 6.2 10*3/uL (ref 1.7–7.7)
Neutrophils Relative %: 71 %
Platelets: 115 10*3/uL — ABNORMAL LOW (ref 150–400)
RBC: 3.09 MIL/uL — ABNORMAL LOW (ref 4.22–5.81)
RDW: 15.4 % (ref 11.5–15.5)
WBC: 8.7 10*3/uL (ref 4.0–10.5)
nRBC: 0 % (ref 0.0–0.2)

## 2019-09-05 LAB — COMPREHENSIVE METABOLIC PANEL
ALT: 26 U/L (ref 0–44)
AST: 19 U/L (ref 15–41)
Albumin: 2.5 g/dL — ABNORMAL LOW (ref 3.5–5.0)
Alkaline Phosphatase: 76 U/L (ref 38–126)
Anion gap: 8 (ref 5–15)
BUN: 6 mg/dL — ABNORMAL LOW (ref 8–23)
CO2: 22 mmol/L (ref 22–32)
Calcium: 7.8 mg/dL — ABNORMAL LOW (ref 8.9–10.3)
Chloride: 104 mmol/L (ref 98–111)
Creatinine, Ser: 0.76 mg/dL (ref 0.61–1.24)
GFR calc Af Amer: >60 mL/min (ref >60)
GFR calc non Af Amer: >60 mL/min (ref >60)
Glucose, Bld: 171 mg/dL — ABNORMAL HIGH (ref 70–99)
Potassium: 3.5 mmol/L (ref 3.5–5.1)
Sodium: 134 mmol/L — ABNORMAL LOW (ref 135–145)
Total Bilirubin: 0.8 mg/dL (ref 0.3–1.2)
Total Protein: 5.1 g/dL — ABNORMAL LOW (ref 6.5–8.1)

## 2019-09-05 LAB — CBC
HCT: 21.8 % — ABNORMAL LOW (ref 39.0–52.0)
Hemoglobin: 7.1 g/dL — ABNORMAL LOW (ref 13.0–17.0)
MCH: 23.9 pg — ABNORMAL LOW (ref 26.0–34.0)
MCHC: 32.6 g/dL (ref 30.0–36.0)
MCV: 73.4 fL — ABNORMAL LOW (ref 80.0–100.0)
Platelets: 110 10*3/uL — ABNORMAL LOW (ref 150–400)
RBC: 2.97 MIL/uL — ABNORMAL LOW (ref 4.22–5.81)
RDW: 15.5 % (ref 11.5–15.5)
WBC: 8.1 10*3/uL (ref 4.0–10.5)
nRBC: 0.4 % — ABNORMAL HIGH (ref 0.0–0.2)

## 2019-09-05 LAB — PROCALCITONIN: Procalcitonin: 0.34 ng/mL

## 2019-09-05 LAB — URINE CULTURE

## 2019-09-05 MED ORDER — POLYETHYLENE GLYCOL 3350 17 G PO PACK
17.0000 g | PACK | Freq: Two times a day (BID) | ORAL | Status: DC
  Administered 2019-09-05 – 2019-09-14 (×11): 17 g via ORAL
  Filled 2019-09-05 (×16): qty 1

## 2019-09-05 MED ORDER — OXYCODONE-ACETAMINOPHEN 5-325 MG PO TABS: 1.0000 | ORAL_TABLET | Freq: Four times a day (QID) | ORAL | 0 refills | Status: DC | PRN

## 2019-09-05 MED ORDER — ASPIRIN 325 MG PO TABS
325.0000 mg | ORAL_TABLET | Freq: Every day | ORAL | Status: DC
  Administered 2019-09-05 – 2019-09-14 (×10): 325 mg via ORAL
  Filled 2019-09-05 (×10): qty 1

## 2019-09-05 MED ORDER — ASPIRIN 325 MG PO TABS: 325.0000 mg | ORAL_TABLET | Freq: Every day | ORAL | Status: DC

## 2019-09-05 MED ORDER — SODIUM CHLORIDE 0.9 % IV SOLN
1.0000 g | INTRAVENOUS | Status: DC
  Administered 2019-09-05: 1 g via INTRAVENOUS
  Filled 2019-09-05: qty 10

## 2019-09-05 NOTE — Progress Notes (Signed)
OT Cancellation Note  Patient Details Name: Kristopher Dalton MRN: [id number] DOB: July 03, 1951   Cancelled Treatment:    Reason Eval/Treat Not Completed: Patient not medically ready(low BP currently) OT to reattempt at a later time due to decreased BP at this time (finishing PT session)  Billey Chang, OTR/L  Acute Rehabilitation Services Pager: [phone] Office: [phone] .  09/05/2019, 10:42 AM

## 2019-09-05 NOTE — Progress Notes (Signed)
Patient temp 101.6, gave tylenol and notified MD. Patient states feeling ok. Denies SOB, on o2 @1lpm  via nasal cannula, satting @ 93-96% .On assessment patient had only voided 120cc during the shift, patient states he doesn't feel like he needs to void. Bladder scan yield >500 ml of urine. MD notified and received order to place F/C and collect urine sample. F/C placed and 500cc of clear yellow urine noted. Patient encouraged to use incentive spirometer.

## 2019-09-05 NOTE — Plan of Care (Signed)

## 2019-09-05 NOTE — Progress Notes (Addendum)
   Subjective: 1 Day Post-Op Procedure(s) (LRB): SHORT AFFIXUS TROCHANTERIC NAIL (Left) OPEN REDUCTION INTERNAL FIXATION (ORIF) PLATE CLAVICULAR FRACTURE (Left) Patient reports pain as moderate.    Objective: Vital signs in last 24 hours: Temp:  [98.5 F (36.9 C)-100.1 F (37.8 C)] 98.8 F (37.1 C) (12/21 0741) Pulse Rate:  [86-102] 93 (12/21 0741) Resp:  [14-19] 18 (12/21 0741) BP: (106-126)/(58-72) 106/58 (12/21 0741) SpO2:  [92 %-100 %] 92 % (12/21 0741)  Intake/Output from previous day: 12/20 0701 - 12/21 0700 In: 2010 [P.O.:360; I.V.:1150; IV Piggyback:500] Out: 2580 [Urine:2500; Blood:80] Intake/Output this shift: No intake/output data recorded.  Recent Labs    09/02/19 1754 09/03/19 0437 09/03/19 1434 09/04/19 0426 09/05/19 0557  HGB 12.6* 11.0* 9.8* 10.0* 7.1*   Recent Labs    09/04/19 0426 09/05/19 0557  WBC 10.5 8.1  RBC 4.24 2.97*  HCT 31.2* 21.8*  PLT 144* 110*   Recent Labs    09/04/19 0654 09/05/19 0557  NA 134* 134*  K 3.3* 3.5  CL 102 104  CO2 22 22  BUN 12 6*  CREATININE 0.84 0.76  GLUCOSE 124* 171*  CALCIUM 8.3* 7.8*   Recent Labs    09/02/19 1757  INR 1.1    Neurologically intact DG Clavicle Left  Result Date: 09/04/2019 CLINICAL DATA:  ORIF of left clavicular fracture EXAM: LEFT CLAVICLE - 2+ VIEWS COMPARISON:  None. FINDINGS: The patient is status post ORIF of a left clavicular fracture. A plate has been affixed to the left clavicle with multiple screws. Skin staples are noted. IMPRESSION: ORIF of left clavicular fracture as above. Electronically Signed   By: Dorise Bullion III M.D   On: 09/04/2019 14:24   DG C-Arm 1-60 Min  Result Date: 09/04/2019 CLINICAL DATA:  Left hip fracture repair EXAM: DG C-ARM 1-60 MIN FLUOROSCOPY TIME:  Number of Acquired Spot Images: 0 COMPARISON:  None. FINDINGS: A gamma nail and left femora rod have been placed across the left-sided hip fracture. A distal interlocking screw is noted.  IMPRESSION: Left hip fracture repair as above. Electronically Signed   By: Dorise Bullion III M.D   On: 09/04/2019 14:24   DG HIP OPERATIVE UNILAT W OR W/O PELVIS LEFT  Result Date: 09/04/2019 CLINICAL DATA:  Repair of a left hip fracture. EXAM: OPERATIVE LEFT HIP (WITH PELVIS IF PERFORMED) 2 VIEWS TECHNIQUE: Fluoroscopic spot image(s) were submitted for interpretation post-operatively. COMPARISON:  None. FINDINGS: A gamma nail and intramedullary femoral rod have been placed across the intertrochanteric left hip fracture. A distal interlocking screw is identified. IMPRESSION: Left hip fracture repair as above. Electronically Signed   By: Dorise Bullion III M.D   On: 09/04/2019 14:23    Assessment/Plan: 1 Day Post-Op Procedure(s) (LRB): SHORT AFFIXUS TROCHANTERIC NAIL (Left) OPEN REDUCTION INTERNAL FIXATION (ORIF) PLATE CLAVICULAR FRACTURE (Left) Up with therapy,TDWB left LE, left arm in sling for a couple weeks then should be able to WB on left arm to help with transfers. Lives alone will need SNF  Hgb 7.1 . Consider transfusion. Malnurished with albumin 2.5 hypoalbuminia Anemia from fracture blood loss and surgery. Acute blood loss anemia Marybelle Killings 09/05/2019, 9:51 AM
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
## 2019-09-05 NOTE — Progress Notes (Signed)
Paged for patients fever to 101.6.No new symptoms on RN assessment.  Started on supplemental oxygen because patient sating at 89% briefly with moving patient. Patient had unrevealing infectuous workup before surgery, and asymptomatic    Paged a second time and patient found to have > 500 in bladder, put out 120 ml total today.     Assessment:  Fever with unknown source-  Patient continues to be febrile, concerning for underlying infection.  Possible systemic response to fractures and surgery but will further workup.  Ordered repeat UA because last collection showed multi species. CBC with diff, and procalcitonin to guide therapy.  Plan: - insert foley - start ceftriaxone - Follow up procalcitonin, CBC, and UA
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
## 2019-09-06 LAB — CBC
HCT: 20.8 % — ABNORMAL LOW (ref 39.0–52.0)
Hemoglobin: 6.8 g/dL — CL (ref 13.0–17.0)
MCH: 23.9 pg — ABNORMAL LOW (ref 26.0–34.0)
MCHC: 32.7 g/dL (ref 30.0–36.0)
MCV: 73 fL — ABNORMAL LOW (ref 80.0–100.0)
Platelets: 121 10*3/uL — ABNORMAL LOW (ref 150–400)
RBC: 2.85 MIL/uL — ABNORMAL LOW (ref 4.22–5.81)
RDW: 15.5 % (ref 11.5–15.5)
WBC: 8.1 10*3/uL (ref 4.0–10.5)
nRBC: 0.4 % — ABNORMAL HIGH (ref 0.0–0.2)

## 2019-09-06 LAB — BASIC METABOLIC PANEL
Anion gap: 10 (ref 5–15)
BUN: 5 mg/dL — ABNORMAL LOW (ref 8–23)
CO2: 23 mmol/L (ref 22–32)
Calcium: 8.1 mg/dL — ABNORMAL LOW (ref 8.9–10.3)
Chloride: 106 mmol/L (ref 98–111)
Creatinine, Ser: 0.69 mg/dL (ref 0.61–1.24)
GFR calc Af Amer: >60 mL/min (ref >60)
GFR calc non Af Amer: >60 mL/min (ref >60)
Glucose, Bld: 140 mg/dL — ABNORMAL HIGH (ref 70–99)
Potassium: 3.5 mmol/L (ref 3.5–5.1)
Sodium: 139 mmol/L (ref 135–145)

## 2019-09-06 LAB — TYPE AND SCREEN
ABO/RH(D): B POS
Antibody Screen: NEGATIVE
Unit division: 0
Unit division: 0

## 2019-09-06 LAB — RESPIRATORY PANEL BY RT PCR (FLU A&B, COVID)
Influenza A by PCR: NEGATIVE
Influenza B by PCR: NEGATIVE
SARS Coronavirus 2 by RT PCR: NEGATIVE

## 2019-09-06 LAB — C-REACTIVE PROTEIN: CRP: 22.1 mg/dL — ABNORMAL HIGH (ref <1.0)

## 2019-09-06 LAB — BPAM RBC
Blood Product Expiration Date: 202101202359
Blood Product Expiration Date: 202101202359
Unit Type and Rh: 7300
Unit Type and Rh: 7300

## 2019-09-06 LAB — SEDIMENTATION RATE: Sed Rate: 76 mm/hr — ABNORMAL HIGH (ref 0–16)

## 2019-09-06 LAB — HEMOGLOBIN AND HEMATOCRIT, BLOOD
HCT: 24.2 % — ABNORMAL LOW (ref 39.0–52.0)
Hemoglobin: 8.1 g/dL — ABNORMAL LOW (ref 13.0–17.0)

## 2019-09-06 LAB — PREPARE RBC (CROSSMATCH)

## 2019-09-06 MED ORDER — ENOXAPARIN SODIUM 40 MG/0.4ML ~~LOC~~ SOLN
40.0000 mg | SUBCUTANEOUS | Status: DC
  Administered 2019-09-06 – 2019-09-14 (×9): 40 mg via SUBCUTANEOUS
  Filled 2019-09-06 (×9): qty 0.4

## 2019-09-06 MED ORDER — SODIUM CHLORIDE 0.9% IV SOLUTION: Freq: Once | INTRAVENOUS | Status: AC

## 2019-09-06 MED ORDER — VITAMIN B-12 1000 MCG PO TABS
1000.0000 ug | ORAL_TABLET | Freq: Every day | ORAL | Status: DC
  Administered 2019-09-06 – 2019-09-14 (×9): 1000 ug via ORAL
  Filled 2019-09-06 (×9): qty 1

## 2019-09-06 NOTE — Plan of Care (Signed)
  Problem: Education: Goal: Knowledge of General Education information will improve Description: Including pain rating scale, medication(s)/side effects and non-pharmacologic comfort measures 09/06/2019 1725 by Stevan Born, RN Outcome: Progressing 09/06/2019 1151 by Stevan Born, RN Outcome: Progressing   Problem: Health Behavior/Discharge Planning: Goal: Ability to manage health-related needs will improve Outcome: Progressing   Problem: Clinical Measurements: Goal: Will remain free from infection 09/06/2019 1725 by Stevan Born, RN Outcome: Progressing 09/06/2019 1151 by Stevan Born, RN Outcome: Progressing Goal: Respiratory complications will improve Outcome: Progressing Goal: Cardiovascular complication will be avoided Outcome: Progressing   Problem: Activity: Goal: Risk for activity intolerance will decrease 09/06/2019 1725 by Stevan Born, RN Outcome: Progressing 09/06/2019 1151 by Stevan Born, RN Outcome: Progressing   Problem: Coping: Goal: Level of anxiety will decrease Outcome: Progressing   Problem: Pain Managment: Goal: General experience of comfort will improve 09/06/2019 1725 by Stevan Born, RN Outcome: Progressing 09/06/2019 1151 by Stevan Born, RN Outcome: Progressing   Problem: Safety: Goal: Ability to remain free from injury will improve 09/06/2019 1725 by Stevan Born, RN Outcome: Progressing 09/06/2019 1151 by Stevan Born, RN Outcome: Progressing   Problem: Skin Integrity: Goal: Risk for impaired skin integrity will decrease 09/06/2019 1725 by Stevan Born, RN Outcome: Progressing 09/06/2019 1151 by Stevan Born, RN Outcome: Progressing

## 2019-09-06 NOTE — Plan of Care (Signed)
Infection Prevention called Zhoe Catania, RN around [phone] this AM stating the MD would be ordering a COVID test and that the pt would need to be placed on airborne/droplet precautions in the meantime for unknown origin of elevated temperature 102F at 0400. Tylenol was given just after the elevated temperature was discovered. They stated that the MD would be placing orders.   Johnathan Tortorelli, RN informed staff and called IP again to update them about a decrease in fever to 99.57F around 0900. The person that answered stated it was probably [place] who called earlier and that she would reach out to her and then one of them would call Ghada Abbett back.   Cletus Paris called Court Joy, MD about the recent temperatures and inquired about whether or not to continue with administering blood for low HGB. MD stated to proceed with the blood administration despite the elevated temperature. He also stated that he was working on getting approval for placing the COVID test order and that the pt would need to be on airborne precautions in the meantime to be safe. He reassured that he would p[lace orders when he got approval. I stated understanding.  Addendum 1200: Pts latest temperature was 98.57F. I have still not received a call back from IP. No orders have been placed yet. Continuing to monitor.   Problem: Education: Goal: Knowledge of General Education information will improve Description: Including pain rating scale, medication(s)/side effects and non-pharmacologic comfort measures Outcome: Progressing   Problem: Health Behavior/Discharge Planning: Goal: Ability to manage health-related needs will improve Outcome: Progressing   Problem: Clinical Measurements: Goal: Will remain free from infection Outcome: Progressing   Problem: Activity: Goal: Risk for activity intolerance will decrease Outcome: Progressing   Problem: Coping: Goal: Level of anxiety will decrease Outcome: Progressing   Problem: Pain Managment: Goal: General  experience of comfort will improve Outcome: Progressing   Problem: Safety: Goal: Ability to remain free from injury will improve Outcome: Progressing   Problem: Skin Integrity: Goal: Risk for impaired skin integrity will decrease Outcome: Progressing
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
## 2019-09-06 NOTE — Progress Notes (Signed)
Lab reorted critical HGB of 6.8 results relayed to Dr Charleen Kirks, Orders for Type and Screen and 1 unit PRBC.

## 2019-09-06 NOTE — Progress Notes (Signed)
Contacted Tamsen Meek and Dr. Court Joy to discuss increased temperature, decreased BP and increased pulse rate.  Verbal order received for rapid covid test.  Flo Shanks contacted for order approval.  Elmo Putt entered order for rapid covid.  Sophie, RN contacted [facility]OT to obtain specimen for covid swab.
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
## 2019-09-06 NOTE — Progress Notes (Signed)
   Subjective: 2 Days Post-Op Procedure(s) (LRB): SHORT AFFIXUS TROCHANTERIC NAIL (Left) OPEN REDUCTION INTERNAL FIXATION (ORIF) PLATE CLAVICULAR FRACTURE (Left) Patient reports pain as mild and moderate.  " I want to go home and my niece could be there to take care of me, I do not want to go to a nursing home. "  Objective: Vital signs in last 24 hours: Temp:  [98.5 F (36.9 C)-102.7 F (39.3 C)] 102.7 F (39.3 C) (12/22 0448) Pulse Rate:  [88-106] 105 (12/22 0448) Resp:  [15-20] 16 (12/22 0448) BP: (66-124)/(53-90) 112/90 (12/22 0448) SpO2:  [84 %-100 %] 98 % (12/22 0448)  Intake/Output from previous day: 12/21 0701 - 12/22 0700 In: 3619.4 [P.O.:1280; I.V.:2339.4] Out: 1500 [Urine:1500] Intake/Output this shift: No intake/output data recorded.  Recent Labs    09/03/19 1434 09/04/19 0426 09/05/19 0557 09/05/19 1635 09/06/19 0436  HGB 9.8* 10.0* 7.1* 7.3* 6.8*   Recent Labs    09/05/19 1635 09/06/19 0436  WBC 8.7 8.1  RBC 3.09* 2.85*  HCT 22.5* 20.8*  PLT 115* 121*   Recent Labs    09/05/19 0557 09/06/19 0436  NA 134* 139  K 3.5 3.5  CL 104 106  CO2 22 23  BUN 6* 5*  CREATININE 0.76 0.69  GLUCOSE 171* 140*  CALCIUM 7.8* 8.1*   No results for input(s): LABPT, INR in the last 72 hours.  Neurologically intact No results found.  Assessment/Plan: 2 Days Post-Op Procedure(s) (LRB): SHORT AFFIXUS TROCHANTERIC NAIL (Left) OPEN REDUCTION INTERNAL FIXATION (ORIF) PLATE CLAVICULAR FRACTURE (Left) Up with therapy. Last Hgb 7.3 , yesterday AM was 7.1. plan therapy for transfers with FWB right UE and LE and TDWB left LE. Left arm in sling due to ORIF clavicle.   Kristopher Dalton 09/06/2019, 7:46 AM

## 2019-09-07 DIAGNOSIS — D649 Anemia, unspecified: Secondary | ICD-10-CM

## 2019-09-07 DIAGNOSIS — M25462 Effusion, left knee: Secondary | ICD-10-CM

## 2019-09-07 DIAGNOSIS — R5082 Postprocedural fever: Secondary | ICD-10-CM

## 2019-09-07 DIAGNOSIS — R509 Fever, unspecified: Secondary | ICD-10-CM

## 2019-09-07 DIAGNOSIS — Z79899 Other long term (current) drug therapy: Secondary | ICD-10-CM

## 2019-09-07 LAB — TYPE AND SCREEN
ABO/RH(D): B POS
Antibody Screen: NEGATIVE
Unit division: 0

## 2019-09-07 LAB — CBC
HCT: 25.5 % — ABNORMAL LOW (ref 39.0–52.0)
Hemoglobin: 8.4 g/dL — ABNORMAL LOW (ref 13.0–17.0)
MCH: 24.6 pg — ABNORMAL LOW (ref 26.0–34.0)
MCHC: 32.9 g/dL (ref 30.0–36.0)
MCV: 74.8 fL — ABNORMAL LOW (ref 80.0–100.0)
Platelets: 142 10*3/uL — ABNORMAL LOW (ref 150–400)
RBC: 3.41 MIL/uL — ABNORMAL LOW (ref 4.22–5.81)
RDW: 16.1 % — ABNORMAL HIGH (ref 11.5–15.5)
WBC: 7.5 10*3/uL (ref 4.0–10.5)
nRBC: 0.4 % — ABNORMAL HIGH (ref 0.0–0.2)

## 2019-09-07 LAB — BASIC METABOLIC PANEL
Anion gap: 11 (ref 5–15)
BUN: 7 mg/dL — ABNORMAL LOW (ref 8–23)
CO2: 22 mmol/L (ref 22–32)
Calcium: 8.3 mg/dL — ABNORMAL LOW (ref 8.9–10.3)
Chloride: 106 mmol/L (ref 98–111)
Creatinine, Ser: 0.72 mg/dL (ref 0.61–1.24)
GFR calc Af Amer: >60 mL/min (ref >60)
GFR calc non Af Amer: >60 mL/min (ref >60)
Glucose, Bld: 107 mg/dL — ABNORMAL HIGH (ref 70–99)
Potassium: 3.9 mmol/L (ref 3.5–5.1)
Sodium: 139 mmol/L (ref 135–145)

## 2019-09-07 LAB — SYNOVIAL CELL COUNT + DIFF, W/ CRYSTALS
Crystals, Fluid: NONE SEEN
Eosinophils-Synovial: 0 % (ref 0–1)
Lymphocytes-Synovial Fld: 3 % (ref 0–20)
Monocyte-Macrophage-Synovial Fluid: 6 % — ABNORMAL LOW (ref 50–90)
Neutrophil, Synovial: 91 % — ABNORMAL HIGH (ref 0–25)
WBC, Synovial: 1444 /mm3 — ABNORMAL HIGH (ref 0–200)

## 2019-09-07 LAB — BPAM RBC
Blood Product Expiration Date: 202101202359
ISSUE DATE / TIME: 202012221102
Unit Type and Rh: 7300

## 2019-09-07 LAB — C-REACTIVE PROTEIN: CRP: 22.1 mg/dL — ABNORMAL HIGH (ref <1.0)
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
## 2019-09-07 NOTE — Progress Notes (Addendum)
Procedure Note: Left Knee Arthrocentesis   Indication:  Left Knee Effusion  Operators: Drs Benjamine Mola / Philipp Ovens  The patient was provided with risks, benefits, and alternatives to left knee arthrocentesis. He consented to the procedure for his left knee effusion and work up of his fever. After a time out was performed, the knee was prepped in a sterile fashion. A 21 gauge 1 and 1/2 inch needle was used to enter the suprapatellar bursa at the superior medial aspect of the knee, with the patient laying flat and the knee propped at a 45 degree angle. The left knee space was entered without difficulty, entered successfully on the first attempt. 50 ml of bloody drainage was aspirated from the knee, which was sent to the lab for cell count, gram stain, and culture. The patient tolerated the procedures well without complication.

## 2019-09-07 NOTE — Progress Notes (Signed)
Internal Medicine Attending Note:  I have seen and evaluated this patient and I have discussed the plan of care with the house staff. Please see their note for complete details. I concur with their findings.  Patient continues to have post op fevers, 101.3 yesterday evening. Infectious work up has been non contributory. He does have a moderate sized left knee effusion today, complaining of left knee pain. Suspect this is dependent swelling from his left hip fracture / surgical repair, but with his on going fevers will aspirate today to rule out septic joint. Hold off on antibiotics for now. Monitor WBC curve and fever curve.   Plan for discharge to SNF once medically stable. Anticipate he will need to be afebrile for 24 hours prior to discharge.   Velna Ochs, MD 09/07/2019, 10:46 AM

## 2019-09-07 NOTE — Care Management Important Message (Signed)
Important Message  Patient Details  Name: Kristopher Dalton MRN: [id number] Date of Birth: 06-08-1951   Medicare Important Message Given:  Yes     [name] [address] 09/07/2019, 2:44 PM

## 2019-09-07 NOTE — Plan of Care (Signed)
  Problem: Education: Goal: Knowledge of General Education information will improve Description: Including pain rating scale, medication(s)/side effects and non-pharmacologic comfort measures Outcome: Progressing   Problem: Safety: Goal: Ability to remain free from injury will improve Outcome: Progressing   

## 2019-09-07 NOTE — Plan of Care (Addendum)
Pt concerned that temperature is from blister on back of L knee. When explained more than likely is was not pt refused education & dressing. Specifically requested MDs look at blister in AM to ensure blister was not causing temperature   Problem: Education: Goal: Knowledge of General Education information will improve Description: Including pain rating scale, medication(s)/side effects and non-pharmacologic comfort measures Outcome: Progressing   Problem: Safety: Goal: Ability to remain free from injury will improve Outcome: Progressing

## 2019-09-07 NOTE — Plan of Care (Signed)

## 2019-09-08 LAB — CBC
HCT: 24.5 % — ABNORMAL LOW (ref 39.0–52.0)
Hemoglobin: 8.1 g/dL — ABNORMAL LOW (ref 13.0–17.0)
MCH: 24.5 pg — ABNORMAL LOW (ref 26.0–34.0)
MCHC: 33.1 g/dL (ref 30.0–36.0)
MCV: 74.2 fL — ABNORMAL LOW (ref 80.0–100.0)
Platelets: 188 10*3/uL (ref 150–400)
RBC: 3.3 MIL/uL — ABNORMAL LOW (ref 4.22–5.81)
RDW: 16.6 % — ABNORMAL HIGH (ref 11.5–15.5)
WBC: 8.4 10*3/uL (ref 4.0–10.5)
nRBC: 0.2 % (ref 0.0–0.2)

## 2019-09-08 LAB — BASIC METABOLIC PANEL
Anion gap: 11 (ref 5–15)
BUN: 9 mg/dL (ref 8–23)
CO2: 22 mmol/L (ref 22–32)
Calcium: 8.4 mg/dL — ABNORMAL LOW (ref 8.9–10.3)
Chloride: 104 mmol/L (ref 98–111)
Creatinine, Ser: 0.68 mg/dL (ref 0.61–1.24)
GFR calc Af Amer: >60 mL/min (ref >60)
GFR calc non Af Amer: >60 mL/min (ref >60)
Glucose, Bld: 108 mg/dL — ABNORMAL HIGH (ref 70–99)
Potassium: 3.8 mmol/L (ref 3.5–5.1)
Sodium: 137 mmol/L (ref 135–145)

## 2019-09-08 LAB — C-REACTIVE PROTEIN: CRP: 22.7 mg/dL — ABNORMAL HIGH (ref <1.0)
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
## 2019-09-08 NOTE — Plan of Care (Signed)
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
## 2019-09-09 DIAGNOSIS — D62 Acute posthemorrhagic anemia: Secondary | ICD-10-CM

## 2019-09-09 LAB — CULTURE, BLOOD (ROUTINE X 2)
Culture: NO GROWTH
Culture: NO GROWTH
Special Requests: ADEQUATE

## 2019-09-09 LAB — CBC
HCT: 23.1 % — ABNORMAL LOW (ref 39.0–52.0)
Hemoglobin: 7.7 g/dL — ABNORMAL LOW (ref 13.0–17.0)
MCH: 24.7 pg — ABNORMAL LOW (ref 26.0–34.0)
MCHC: 33.3 g/dL (ref 30.0–36.0)
MCV: 74 fL — ABNORMAL LOW (ref 80.0–100.0)
Platelets: 211 10*3/uL (ref 150–400)
RBC: 3.12 MIL/uL — ABNORMAL LOW (ref 4.22–5.81)
RDW: 17.2 % — ABNORMAL HIGH (ref 11.5–15.5)
WBC: 6.4 10*3/uL (ref 4.0–10.5)
nRBC: 0.3 % — ABNORMAL HIGH (ref 0.0–0.2)

## 2019-09-09 LAB — BASIC METABOLIC PANEL
Anion gap: 10 (ref 5–15)
BUN: 10 mg/dL (ref 8–23)
CO2: 23 mmol/L (ref 22–32)
Calcium: 8.4 mg/dL — ABNORMAL LOW (ref 8.9–10.3)
Chloride: 103 mmol/L (ref 98–111)
Creatinine, Ser: 0.73 mg/dL (ref 0.61–1.24)
GFR calc Af Amer: >60 mL/min (ref >60)
GFR calc non Af Amer: >60 mL/min (ref >60)
Glucose, Bld: 110 mg/dL — ABNORMAL HIGH (ref 70–99)
Potassium: 4 mmol/L (ref 3.5–5.1)
Sodium: 136 mmol/L (ref 135–145)

## 2019-09-09 LAB — C-REACTIVE PROTEIN: CRP: 19.1 mg/dL — ABNORMAL HIGH (ref <1.0)
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
## 2019-09-09 NOTE — Progress Notes (Signed)
Internal Medicine Attending Note:  I have seen and evaluated this patient and I have discussed the plan of care with the house staff. Please see their note for complete details. I concur with their findings.   Velna Ochs, MD 09/09/2019, 11:30 AM

## 2019-09-10 LAB — IRON AND TIBC
Iron: 41 ug/dL — ABNORMAL LOW (ref 45–182)
Saturation Ratios: 25 % (ref 17.9–39.5)
TIBC: 167 ug/dL — ABNORMAL LOW (ref 250–450)
UIBC: 126 ug/dL

## 2019-09-10 LAB — CBC
HCT: 25 % — ABNORMAL LOW (ref 39.0–52.0)
Hemoglobin: 8.1 g/dL — ABNORMAL LOW (ref 13.0–17.0)
MCH: 24.6 pg — ABNORMAL LOW (ref 26.0–34.0)
MCHC: 32.4 g/dL (ref 30.0–36.0)
MCV: 76 fL — ABNORMAL LOW (ref 80.0–100.0)
Platelets: 290 10*3/uL (ref 150–400)
RBC: 3.29 MIL/uL — ABNORMAL LOW (ref 4.22–5.81)
RDW: 17.7 % — ABNORMAL HIGH (ref 11.5–15.5)
WBC: 7.1 10*3/uL (ref 4.0–10.5)
nRBC: 0.3 % — ABNORMAL HIGH (ref 0.0–0.2)

## 2019-09-10 LAB — PHENYTOIN LEVEL, TOTAL: Phenytoin Lvl: 3.9 ug/mL — ABNORMAL LOW (ref 10.0–20.0)

## 2019-09-10 LAB — PHENOBARBITAL LEVEL: Phenobarbital: 18.5 ug/mL (ref 15.0–30.0)

## 2019-09-10 LAB — BASIC METABOLIC PANEL
Anion gap: 11 (ref 5–15)
BUN: 11 mg/dL (ref 8–23)
CO2: 21 mmol/L — ABNORMAL LOW (ref 22–32)
Calcium: 8.5 mg/dL — ABNORMAL LOW (ref 8.9–10.3)
Chloride: 105 mmol/L (ref 98–111)
Creatinine, Ser: 0.69 mg/dL (ref 0.61–1.24)
GFR calc Af Amer: >60 mL/min (ref >60)
GFR calc non Af Amer: >60 mL/min (ref >60)
Glucose, Bld: 116 mg/dL — ABNORMAL HIGH (ref 70–99)
Potassium: 3.9 mmol/L (ref 3.5–5.1)
Sodium: 137 mmol/L (ref 135–145)

## 2019-09-10 LAB — C-REACTIVE PROTEIN: CRP: 16.7 mg/dL — ABNORMAL HIGH (ref <1.0)

## 2019-09-10 LAB — FERRITIN: Ferritin: 515 ng/mL — ABNORMAL HIGH (ref 24–336)
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
## 2019-09-10 NOTE — Progress Notes (Addendum)
Internal Medicine Attending Note:  This patient's plan of care was discussed with the house staff. Please see their note for complete details. I concur with their findings.   Velna Ochs, MD 09/10/2019, 2:00 PM

## 2019-09-11 LAB — BASIC METABOLIC PANEL
Anion gap: 9 (ref 5–15)
BUN: 10 mg/dL (ref 8–23)
CO2: 23 mmol/L (ref 22–32)
Calcium: 8.6 mg/dL — ABNORMAL LOW (ref 8.9–10.3)
Chloride: 102 mmol/L (ref 98–111)
Creatinine, Ser: 0.65 mg/dL (ref 0.61–1.24)
GFR calc Af Amer: >60 mL/min (ref >60)
GFR calc non Af Amer: >60 mL/min (ref >60)
Glucose, Bld: 103 mg/dL — ABNORMAL HIGH (ref 70–99)
Potassium: 4.1 mmol/L (ref 3.5–5.1)
Sodium: 134 mmol/L — ABNORMAL LOW (ref 135–145)

## 2019-09-11 LAB — CBC
HCT: 25.3 % — ABNORMAL LOW (ref 39.0–52.0)
Hemoglobin: 8.2 g/dL — ABNORMAL LOW (ref 13.0–17.0)
MCH: 24.6 pg — ABNORMAL LOW (ref 26.0–34.0)
MCHC: 32.4 g/dL (ref 30.0–36.0)
MCV: 75.7 fL — ABNORMAL LOW (ref 80.0–100.0)
Platelets: 324 10*3/uL (ref 150–400)
RBC: 3.34 MIL/uL — ABNORMAL LOW (ref 4.22–5.81)
RDW: 17.8 % — ABNORMAL HIGH (ref 11.5–15.5)
WBC: 7.4 10*3/uL (ref 4.0–10.5)
nRBC: 0.4 % — ABNORMAL HIGH (ref 0.0–0.2)
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
## 2019-09-11 NOTE — Progress Notes (Signed)
Transfer from chair to bed with gait belt and support left leg with knees
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
## 2019-09-12 LAB — CBC
HCT: 25.4 % — ABNORMAL LOW (ref 39.0–52.0)
Hemoglobin: 8.1 g/dL — ABNORMAL LOW (ref 13.0–17.0)
MCH: 24.5 pg — ABNORMAL LOW (ref 26.0–34.0)
MCHC: 31.9 g/dL (ref 30.0–36.0)
MCV: 76.7 fL — ABNORMAL LOW (ref 80.0–100.0)
Platelets: 362 10*3/uL (ref 150–400)
RBC: 3.31 MIL/uL — ABNORMAL LOW (ref 4.22–5.81)
RDW: 18.3 % — ABNORMAL HIGH (ref 11.5–15.5)
WBC: 7.2 10*3/uL (ref 4.0–10.5)
nRBC: 0.3 % — ABNORMAL HIGH (ref 0.0–0.2)

## 2019-09-12 LAB — BASIC METABOLIC PANEL
Anion gap: 9 (ref 5–15)
BUN: 8 mg/dL (ref 8–23)
CO2: 24 mmol/L (ref 22–32)
Calcium: 8.6 mg/dL — ABNORMAL LOW (ref 8.9–10.3)
Chloride: 101 mmol/L (ref 98–111)
Creatinine, Ser: 0.7 mg/dL (ref 0.61–1.24)
GFR calc Af Amer: >60 mL/min (ref >60)
GFR calc non Af Amer: >60 mL/min (ref >60)
Glucose, Bld: 103 mg/dL — ABNORMAL HIGH (ref 70–99)
Potassium: 3.8 mmol/L (ref 3.5–5.1)
Sodium: 134 mmol/L — ABNORMAL LOW (ref 135–145)

## 2019-09-12 LAB — AEROBIC/ANAEROBIC CULTURE W GRAM STAIN (SURGICAL/DEEP WOUND): Culture: NO GROWTH

## 2019-09-12 NOTE — Progress Notes (Signed)
  Date: 09/12/2019  Patient name: Kristopher Dalton  Medical record number: [id number]  Date of birth: Jan 15, 1951   This patient's plan of care was discussed with the house staff. Please see Dr. Noni Saupe note for complete details. I concur with her findings.   Sid Falcon, MD 09/12/2019, 1:31 PM
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
## 2019-09-12 NOTE — Plan of Care (Signed)
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
## 2019-09-13 DIAGNOSIS — Z9889 Other specified postprocedural states: Secondary | ICD-10-CM

## 2019-09-13 LAB — CBC WITH DIFFERENTIAL/PLATELET
Abs Immature Granulocytes: 0.11 10*3/uL — ABNORMAL HIGH (ref 0.00–0.07)
Basophils Absolute: 0 10*3/uL (ref 0.0–0.1)
Basophils Relative: 0 %
Eosinophils Absolute: 0.1 10*3/uL (ref 0.0–0.5)
Eosinophils Relative: 2 %
HCT: 26.6 % — ABNORMAL LOW (ref 39.0–52.0)
Hemoglobin: 8.5 g/dL — ABNORMAL LOW (ref 13.0–17.0)
Immature Granulocytes: 1 %
Lymphocytes Relative: 18 %
Lymphs Abs: 1.5 10*3/uL (ref 0.7–4.0)
MCH: 24.4 pg — ABNORMAL LOW (ref 26.0–34.0)
MCHC: 32 g/dL (ref 30.0–36.0)
MCV: 76.4 fL — ABNORMAL LOW (ref 80.0–100.0)
Monocytes Absolute: 0.7 10*3/uL (ref 0.1–1.0)
Monocytes Relative: 9 %
Neutro Abs: 5.6 10*3/uL (ref 1.7–7.7)
Neutrophils Relative %: 70 %
Platelets: 414 10*3/uL — ABNORMAL HIGH (ref 150–400)
RBC: 3.48 MIL/uL — ABNORMAL LOW (ref 4.22–5.81)
RDW: 18.7 % — ABNORMAL HIGH (ref 11.5–15.5)
WBC: 8.1 10*3/uL (ref 4.0–10.5)
nRBC: 0.2 % (ref 0.0–0.2)

## 2019-09-13 LAB — SARS CORONAVIRUS 2 (TAT 6-24 HRS): SARS Coronavirus 2: NEGATIVE

## 2019-09-13 LAB — BASIC METABOLIC PANEL
Anion gap: 9 (ref 5–15)
BUN: 11 mg/dL (ref 8–23)
CO2: 25 mmol/L (ref 22–32)
Calcium: 8.9 mg/dL (ref 8.9–10.3)
Chloride: 103 mmol/L (ref 98–111)
Creatinine, Ser: 0.59 mg/dL — ABNORMAL LOW (ref 0.61–1.24)
GFR calc Af Amer: >60 mL/min (ref >60)
GFR calc non Af Amer: >60 mL/min (ref >60)
Glucose, Bld: 106 mg/dL — ABNORMAL HIGH (ref 70–99)
Potassium: 4.1 mmol/L (ref 3.5–5.1)
Sodium: 137 mmol/L (ref 135–145)

## 2019-09-13 LAB — GLUCOSE, CAPILLARY: Glucose-Capillary: 104 mg/dL — ABNORMAL HIGH (ref 70–99)
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
## 2019-09-13 NOTE — Progress Notes (Signed)
  Date: 09/13/2019  Patient name: Kristopher Dalton  Medical record number: [id number]  Date of birth: 21-Jan-1951        I have seen and evaluated this patient and I have discussed the plan of care with the house staff. Please see Dr. Noni Saupe note for complete details. I concur with her findings and plan.  Sid Falcon, MD 09/13/2019, 8:33 PM
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
## 2019-09-13 NOTE — Plan of Care (Signed)
  Problem: Clinical Measurements: Goal: Will remain free from infection Outcome: Progressing   Problem: Education: Goal: Knowledge of General Education information will improve Description: Including pain rating scale, medication(s)/side effects and non-pharmacologic comfort measures Outcome: Progressing   Problem: Safety: Goal: Ability to remain free from injury will improve Outcome: Progressing   Problem: Skin Integrity: Goal: Risk for impaired skin integrity will decrease Outcome: Progressing

## 2019-09-14 LAB — CBC WITH DIFFERENTIAL/PLATELET
Abs Immature Granulocytes: 0.13 10*3/uL — ABNORMAL HIGH (ref 0.00–0.07)
Basophils Absolute: 0 10*3/uL (ref 0.0–0.1)
Basophils Relative: 0 %
Eosinophils Absolute: 0.1 10*3/uL (ref 0.0–0.5)
Eosinophils Relative: 1 %
HCT: 26.1 % — ABNORMAL LOW (ref 39.0–52.0)
Hemoglobin: 8.2 g/dL — ABNORMAL LOW (ref 13.0–17.0)
Immature Granulocytes: 1 %
Lymphocytes Relative: 19 %
Lymphs Abs: 1.7 10*3/uL (ref 0.7–4.0)
MCH: 24.3 pg — ABNORMAL LOW (ref 26.0–34.0)
MCHC: 31.4 g/dL (ref 30.0–36.0)
MCV: 77.4 fL — ABNORMAL LOW (ref 80.0–100.0)
Monocytes Absolute: 0.8 10*3/uL (ref 0.1–1.0)
Monocytes Relative: 9 %
Neutro Abs: 6.2 10*3/uL (ref 1.7–7.7)
Neutrophils Relative %: 70 %
Platelets: 446 10*3/uL — ABNORMAL HIGH (ref 150–400)
RBC: 3.37 MIL/uL — ABNORMAL LOW (ref 4.22–5.81)
RDW: 19.1 % — ABNORMAL HIGH (ref 11.5–15.5)
WBC: 9 10*3/uL (ref 4.0–10.5)
nRBC: 0.3 % — ABNORMAL HIGH (ref 0.0–0.2)

## 2019-09-14 LAB — PHENYTOIN LEVEL, TOTAL: Phenytoin Lvl: 2.8 ug/mL — ABNORMAL LOW (ref 10.0–20.0)

## 2019-09-14 MED ORDER — HYDROCODONE-ACETAMINOPHEN 5-325 MG PO TABS: 1.0000 | ORAL_TABLET | Freq: Four times a day (QID) | ORAL | 0 refills | Status: AC | PRN

## 2019-09-14 MED ORDER — ASPIRIN 325 MG PO TABS: 325.0000 mg | ORAL_TABLET | Freq: Every day | ORAL | 0 refills | Status: AC

## 2019-09-14 NOTE — Care Management Important Message (Signed)
Important Message  Patient Details  Name: Kristopher Dalton MRN: [id number] Date of Birth: 06-May-1951   Medicare Important Message Given:  Yes     Memory Argue 09/14/2019, 1:26 PM

## 2019-09-14 NOTE — TOC Transition Note (Signed)
Transition of Care [facility]) - CM/SW Discharge Note   Patient Details  Name: Kristopher Dalton MRN: [id number] Date of Birth: 01-28-1951  Transition of Care [facility]) CM/SW Contact:  Atilano Median, LCSW Phone Number: 09/14/2019, 10:38 AM   Clinical Narrative:    Discharged to SNF. Referral coordinated with Juliann Pulse. Patient aware and agreeable to this plan. Number to call report [phone] room 102 given to unit RN March Rummage. No other needs at this time. Case closed to this CSW.    Final next level of care: Skilled Nursing Facility Barriers to Discharge: Barriers Resolved   Patient Goals and CMS Choice Patient states their goals for this hospitalization and ongoing recovery are:: return home with the help of my niece CMS Medicare.gov Compare Post Acute Care list provided to:: Patient Choice offered to / list presented to : Patient  Discharge Placement   Existing PASRR number confirmed : 09/13/19          Patient [facility] bed at: [facility] Patient to be transferred to facility by: [place] Name of family member notified: patient Patient and family notified of of transfer: 09/14/19  Discharge Plan and Services In-house Referral: Clinical Social Work   Post Acute Care Choice: Home Health                    HH Arranged: PT   Date [place]: 09/06/19 Time HH Agency Contacted: 1252    Social Determinants of Health (SDOH) Interventions     Readmission Risk Interventions No flowsheet data found.

## 2019-09-14 NOTE — Progress Notes (Signed)
Patient left via ptar transport. Patient was agreeable to dc, blue mask provided to patient per his request. No noted distress.
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
## 2019-09-14 NOTE — Plan of Care (Signed)
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
## 2019-09-14 NOTE — Plan of Care (Signed)

## 2019-09-14 NOTE — Progress Notes (Signed)
  Date: 09/14/2019  Patient name: Kristopher Dalton  Medical record number: [id number]  Date of birth: 14-Feb-1951   This patient's plan of care was discussed with the house staff. Please see Dr. Noni Saupe note for complete details. I concur with her findings.  Planned discharge to SNF today.    Sid Falcon, MD 09/14/2019, 4:09 PM

## 2019-10-07 ENCOUNTER — Inpatient Hospital Stay: Payer: Medicare Other | Admitting: Orthopaedic Surgery

## 2019-10-11 ENCOUNTER — Ambulatory Visit: Payer: Medicare Other | Admitting: Orthopaedic Surgery

## 2019-10-11 ENCOUNTER — Other Ambulatory Visit: Payer: Self-pay

## 2019-10-11 ENCOUNTER — Encounter: Payer: Self-pay | Admitting: Orthopaedic Surgery

## 2019-10-11 ENCOUNTER — Ambulatory Visit: Payer: Self-pay

## 2019-10-11 ENCOUNTER — Ambulatory Visit (INDEPENDENT_AMBULATORY_CARE_PROVIDER_SITE_OTHER): Payer: Medicare Other

## 2019-10-11 VITALS — BP 123/75 | HR 83 | Ht 69.0 in | Wt 170.0 lb

## 2019-10-11 DIAGNOSIS — S42022D Displaced fracture of shaft of left clavicle, subsequent encounter for fracture with routine healing: Secondary | ICD-10-CM

## 2019-10-11 DIAGNOSIS — M25512 Pain in left shoulder: Secondary | ICD-10-CM

## 2019-10-11 DIAGNOSIS — M25552 Pain in left hip: Secondary | ICD-10-CM
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
## 2019-11-09 ENCOUNTER — Ambulatory Visit: Payer: Medicare Other | Admitting: Orthopaedic Surgery

## 2019-11-09 ENCOUNTER — Other Ambulatory Visit: Payer: Self-pay

## 2019-11-09 ENCOUNTER — Ambulatory Visit: Payer: Self-pay

## 2019-11-09 ENCOUNTER — Ambulatory Visit (INDEPENDENT_AMBULATORY_CARE_PROVIDER_SITE_OTHER): Payer: Medicare Other

## 2019-11-09 ENCOUNTER — Encounter: Payer: Self-pay | Admitting: Orthopaedic Surgery

## 2019-11-09 VITALS — Ht 69.0 in | Wt 170.0 lb

## 2019-11-09 DIAGNOSIS — M25552 Pain in left hip: Secondary | ICD-10-CM

## 2019-11-09 DIAGNOSIS — S42022D Displaced fracture of shaft of left clavicle, subsequent encounter for fracture with routine healing: Secondary | ICD-10-CM | POA: Diagnosis not present

## 2019-11-09 DIAGNOSIS — S72142A Displaced intertrochanteric fracture of left femur, initial encounter for closed fracture: Secondary | ICD-10-CM
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
## 2019-12-21 ENCOUNTER — Ambulatory Visit: Payer: Medicare Other | Admitting: Orthopaedic Surgery

## 2019-12-21 ENCOUNTER — Ambulatory Visit: Payer: Self-pay

## 2019-12-21 ENCOUNTER — Other Ambulatory Visit: Payer: Self-pay

## 2019-12-21 ENCOUNTER — Encounter: Payer: Self-pay | Admitting: Orthopaedic Surgery

## 2019-12-21 VITALS — Ht 69.0 in | Wt 170.0 lb

## 2019-12-21 DIAGNOSIS — G8929 Other chronic pain: Secondary | ICD-10-CM

## 2019-12-21 DIAGNOSIS — M25561 Pain in right knee: Secondary | ICD-10-CM | POA: Diagnosis not present

## 2019-12-21 DIAGNOSIS — M6281 Muscle weakness (generalized): Secondary | ICD-10-CM

## 2019-12-21 DIAGNOSIS — S72142A Displaced intertrochanteric fracture of left femur, initial encounter for closed fracture: Secondary | ICD-10-CM | POA: Diagnosis not present

## 2019-12-21 DIAGNOSIS — M25562 Pain in left knee: Secondary | ICD-10-CM
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
## 2020-02-21 ENCOUNTER — Encounter: Payer: Self-pay | Admitting: Orthopaedic Surgery

## 2020-02-21 ENCOUNTER — Ambulatory Visit (INDEPENDENT_AMBULATORY_CARE_PROVIDER_SITE_OTHER): Payer: Medicare Other | Admitting: Orthopaedic Surgery

## 2020-02-21 ENCOUNTER — Ambulatory Visit: Payer: Self-pay

## 2020-02-21 VITALS — BP 81/54 | HR 65 | Ht 69.0 in | Wt 165.0 lb

## 2020-02-21 DIAGNOSIS — M545 Low back pain, unspecified: Secondary | ICD-10-CM

## 2020-02-21 DIAGNOSIS — R29898 Other symptoms and signs involving the musculoskeletal system: Secondary | ICD-10-CM | POA: Diagnosis not present

## 2020-02-21 DIAGNOSIS — G8929 Other chronic pain: Secondary | ICD-10-CM | POA: Diagnosis not present

## 2020-02-21 NOTE — Addendum Note (Signed)
Addended by: Rogers Seeds on: 02/21/2020 11:51 AM   Modules accepted: Orders
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
## 2020-08-22 ENCOUNTER — Ambulatory Visit: Payer: Medicare Other | Admitting: Orthopaedic Surgery

## 2020-08-28 ENCOUNTER — Other Ambulatory Visit: Payer: Self-pay

## 2020-08-28 ENCOUNTER — Ambulatory Visit (INDEPENDENT_AMBULATORY_CARE_PROVIDER_SITE_OTHER): Payer: Medicare Other | Admitting: Orthopaedic Surgery

## 2020-08-28 ENCOUNTER — Encounter: Payer: Self-pay | Admitting: Orthopaedic Surgery

## 2020-08-28 DIAGNOSIS — M6281 Muscle weakness (generalized): Secondary | ICD-10-CM

## 2020-08-28 DIAGNOSIS — M2241 Chondromalacia patellae, right knee: Secondary | ICD-10-CM | POA: Insufficient documentation
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
## 2022-02-25 ENCOUNTER — Ambulatory Visit: Payer: Medicare Other | Admitting: Orthopaedic Surgery

## 2022-02-25 ENCOUNTER — Ambulatory Visit: Payer: Self-pay

## 2022-02-25 ENCOUNTER — Encounter: Payer: Self-pay | Admitting: Orthopaedic Surgery

## 2022-02-25 VITALS — BP 106/67 | HR 63 | Ht 68.0 in | Wt 159.6 lb

## 2022-02-25 DIAGNOSIS — M542 Cervicalgia: Secondary | ICD-10-CM | POA: Diagnosis not present

## 2022-02-25 DIAGNOSIS — G8929 Other chronic pain: Secondary | ICD-10-CM

## 2022-02-25 DIAGNOSIS — M25511 Pain in right shoulder: Secondary | ICD-10-CM
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
## 2022-04-01 ENCOUNTER — Ambulatory Visit: Payer: Medicare Other | Admitting: Orthopaedic Surgery

## 2022-04-23 ENCOUNTER — Ambulatory Visit: Payer: Medicare Other | Admitting: Orthopaedic Surgery

## 2022-04-23 ENCOUNTER — Encounter: Payer: Self-pay | Admitting: Orthopaedic Surgery

## 2022-04-23 VITALS — BP 100/63 | HR 65 | Ht 70.0 in | Wt 159.0 lb

## 2022-04-23 DIAGNOSIS — M7541 Impingement syndrome of right shoulder: Secondary | ICD-10-CM | POA: Diagnosis not present

## 2022-04-23 DIAGNOSIS — M2241 Chondromalacia patellae, right knee: Secondary | ICD-10-CM
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
